# Patient Record
Sex: Male | Born: 1935 | Race: White | Hispanic: No | Smoking: Never smoker
Health system: Southern US, Community
[De-identification: ages and names within clinical notes are randomized; demographics above are authoritative.]

## PROBLEM LIST (undated history)

## (undated) DIAGNOSIS — C61 Malignant neoplasm of prostate: Secondary | ICD-10-CM

## (undated) HISTORY — PX: HERNIA REPAIR: SHX51

## (undated) HISTORY — PX: CHOLECYSTECTOMY: SHX55

## (undated) HISTORY — PX: PROSTATECTOMY: SHX69

---

## 2014-10-30 ENCOUNTER — Emergency Department (HOSPITAL_COMMUNITY): Payer: Medicare HMO

## 2014-10-30 ENCOUNTER — Encounter (HOSPITAL_COMMUNITY): Payer: Self-pay | Admitting: Family Medicine

## 2014-10-30 ENCOUNTER — Inpatient Hospital Stay (HOSPITAL_COMMUNITY)
Admission: EM | Admit: 2014-10-30 | Discharge: 2014-11-02 | DRG: 470 | Disposition: A | Discharge status: Skilled Nursing Facility | Payer: Medicare HMO | Attending: Internal Medicine | Admitting: Internal Medicine

## 2014-10-30 DIAGNOSIS — W19XXXA Unspecified fall, initial encounter: Secondary | ICD-10-CM

## 2014-10-30 DIAGNOSIS — D539 Nutritional anemia, unspecified: Secondary | ICD-10-CM | POA: Diagnosis present

## 2014-10-30 DIAGNOSIS — R52 Pain, unspecified: Secondary | ICD-10-CM | POA: Diagnosis not present

## 2014-10-30 DIAGNOSIS — Z9079 Acquired absence of other genital organ(s): Secondary | ICD-10-CM | POA: Diagnosis present

## 2014-10-30 DIAGNOSIS — M1611 Unilateral primary osteoarthritis, right hip: Secondary | ICD-10-CM | POA: Diagnosis present

## 2014-10-30 DIAGNOSIS — E440 Moderate protein-calorie malnutrition: Secondary | ICD-10-CM | POA: Insufficient documentation

## 2014-10-30 DIAGNOSIS — Z7982 Long term (current) use of aspirin: Secondary | ICD-10-CM

## 2014-10-30 DIAGNOSIS — Z96649 Presence of unspecified artificial hip joint: Secondary | ICD-10-CM

## 2014-10-30 DIAGNOSIS — S72001A Fracture of unspecified part of neck of right femur, initial encounter for closed fracture: Secondary | ICD-10-CM | POA: Diagnosis not present

## 2014-10-30 DIAGNOSIS — Z8546 Personal history of malignant neoplasm of prostate: Secondary | ICD-10-CM

## 2014-10-30 DIAGNOSIS — Z791 Long term (current) use of non-steroidal anti-inflammatories (NSAID): Secondary | ICD-10-CM

## 2014-10-30 DIAGNOSIS — R911 Solitary pulmonary nodule: Secondary | ICD-10-CM | POA: Diagnosis present

## 2014-10-30 DIAGNOSIS — Z79899 Other long term (current) drug therapy: Secondary | ICD-10-CM

## 2014-10-30 DIAGNOSIS — S72009A Fracture of unspecified part of neck of unspecified femur, initial encounter for closed fracture: Secondary | ICD-10-CM | POA: Diagnosis present

## 2014-10-30 HISTORY — DX: Malignant neoplasm of prostate: C61

## 2014-10-30 NOTE — ED Provider Notes (Signed)
CSN: [id number]     Arrival date & time 10/30/14  2310 History   First MD Initiated Contact with Patient 10/30/14 2327     Chief Complaint  Patient presents with  . Fall  . Ankle Injury     (Consider location/radiation/quality/duration/timing/severity/associated sxs/prior Treatment) HPI Comments: Patient is an 81 year old male with little past medical history. He presents for evaluation of a fall. He states he was getting out of the back of a rental truck and climbing down the steps when he missed the second step and fell on his right side. He is complaining of pain in his leg. He is unable to explain exactly where his pain is. He denies any back pain. He denies any numbness or tingling. He states that initially he could not move his leg, however he is now able to.  Patient is a 81 y.o. male presenting with fall. The history is provided by the patient.  Fall This is a new problem. The current episode started 1 to 2 hours ago. The problem occurs constantly. The problem has not changed since onset.Nothing aggravates the symptoms. Nothing relieves the symptoms. He has tried nothing for the symptoms. The treatment provided no relief.    History reviewed. No pertinent past medical history. Past Surgical History  Procedure Laterality Date  . Prostatectomy     History reviewed. No pertinent family history. History  Substance Use Topics  . Smoking status: Never Smoker   . Smokeless tobacco: Not on file  . Alcohol Use: No    Review of Systems  All other systems reviewed and are negative.     Allergies  Review of patient's allergies indicates not on file.  Home Medications   Prior to Admission medications   Not on File   BP 114/60 mmHg  Pulse 72  Temp(Src) 98.4 F (36.9 C) (Oral)  Resp 16  Ht 5\' 10"  (1.778 m)  Wt 158 lb (71.668 kg)  BMI 22.67 kg/m2  SpO2 97% Physical Exam  Constitutional: He is oriented to person, place, and time. He appears well-developed and  well-nourished. No distress.  HENT:  Head: Normocephalic and atraumatic.  Neck: Normal range of motion. Neck supple.  Cardiovascular: Normal rate, regular rhythm and normal heart sounds.   No murmur heard. Pulmonary/Chest: Effort normal and breath sounds normal. No respiratory distress.  Abdominal: Soft. Bowel sounds are normal. He exhibits no distension. There is no tenderness.  Musculoskeletal: He exhibits no edema.  The right leg appears grossly normal. There is no shortening or external rotation. DP and PT pulses are easily palpable. Sensation and motor are intact to the foot. I see no obvious deformity of the ankle, knee, or hip, or tib-fib /femur area.  Neurological: He is alert and oriented to person, place, and time.  Skin: Skin is warm and dry. He is not diaphoretic.  Nursing note and vitals reviewed.   ED Course  Procedures (including critical care time) Labs Review Labs Reviewed - No data to display  Imaging Review No results found.   EKG Interpretation   Date/Time:  Monday October 31 2014 00:55:59 EDT Ventricular Rate:  79 PR Interval:  201 QRS Duration: 100 QT Interval:  418 QTC Calculation: 479 R Axis:   74 Text Interpretation:  Sinus rhythm Borderline repolarization abnormality  Borderline prolonged QT interval Confirmed by Maretta Overdorf  MD, Tamila Gaulin ([id number]) on  10/31/2014 1:02:52 AM      MDM   Final diagnoses:  None    Patient is here from [place]  assisting the family member with a move when he fell off the stairs of the moving van and fractured his right hip. X-rays reveal a femoral neck fracture. I've spoken with Dr. Mayer Camel from orthopedics who is recommending admission to [facility] for surgical repair. I've spoken with Dr. Hal Hope who agrees to admit.    Veryl Speak, MD 10/31/14 [phone]

## 2014-10-30 NOTE — ED Notes (Signed)
Bed: DB52 Expected date:  Expected time:  Means of arrival:  Comments: 81yr old fall, ankle pain

## 2014-10-30 NOTE — ED Notes (Signed)
Patient was transported from [facility]. Pt was getting down from a box truck, missed the 2nd step, twisted, and fell on his right side. Pt's right ankle is rotated outward but can not move his right leg inward.

## 2014-10-31 ENCOUNTER — Encounter (HOSPITAL_COMMUNITY): Payer: Self-pay | Admitting: Internal Medicine

## 2014-10-31 ENCOUNTER — Inpatient Hospital Stay (HOSPITAL_COMMUNITY): Payer: Medicare HMO | Admitting: Anesthesiology

## 2014-10-31 ENCOUNTER — Encounter (HOSPITAL_COMMUNITY)
Admission: EM | Disposition: A | Discharge status: Skilled Nursing Facility | Payer: Self-pay | Source: Home / Self Care | Attending: Internal Medicine

## 2014-10-31 ENCOUNTER — Inpatient Hospital Stay (HOSPITAL_COMMUNITY): Payer: Medicare HMO

## 2014-10-31 ENCOUNTER — Emergency Department (HOSPITAL_COMMUNITY): Payer: Medicare HMO

## 2014-10-31 DIAGNOSIS — Z791 Long term (current) use of non-steroidal anti-inflammatories (NSAID): Secondary | ICD-10-CM | POA: Diagnosis not present

## 2014-10-31 DIAGNOSIS — D539 Nutritional anemia, unspecified: Secondary | ICD-10-CM | POA: Diagnosis present

## 2014-10-31 DIAGNOSIS — Z9079 Acquired absence of other genital organ(s): Secondary | ICD-10-CM | POA: Diagnosis present

## 2014-10-31 DIAGNOSIS — M1611 Unilateral primary osteoarthritis, right hip: Secondary | ICD-10-CM | POA: Diagnosis present

## 2014-10-31 DIAGNOSIS — E440 Moderate protein-calorie malnutrition: Secondary | ICD-10-CM | POA: Insufficient documentation

## 2014-10-31 DIAGNOSIS — Z8546 Personal history of malignant neoplasm of prostate: Secondary | ICD-10-CM | POA: Diagnosis not present

## 2014-10-31 DIAGNOSIS — S72009A Fracture of unspecified part of neck of unspecified femur, initial encounter for closed fracture: Secondary | ICD-10-CM | POA: Diagnosis present

## 2014-10-31 DIAGNOSIS — R52 Pain, unspecified: Secondary | ICD-10-CM | POA: Diagnosis present

## 2014-10-31 DIAGNOSIS — Z7982 Long term (current) use of aspirin: Secondary | ICD-10-CM | POA: Diagnosis not present

## 2014-10-31 DIAGNOSIS — Z79899 Other long term (current) drug therapy: Secondary | ICD-10-CM | POA: Diagnosis not present

## 2014-10-31 DIAGNOSIS — S72001A Fracture of unspecified part of neck of right femur, initial encounter for closed fracture: Secondary | ICD-10-CM | POA: Diagnosis not present

## 2014-10-31 DIAGNOSIS — R911 Solitary pulmonary nodule: Secondary | ICD-10-CM | POA: Diagnosis present

## 2014-10-31 HISTORY — PX: HIP ARTHROPLASTY: SHX981

## 2014-10-31 LAB — CBC WITH DIFFERENTIAL/PLATELET
BASOS PCT: 0 % (ref 0–1)
BASOS PCT: 0 % (ref 0–1)
Basophils Absolute: 0 10*3/uL (ref 0.0–0.1)
Basophils Absolute: 0 10*3/uL (ref 0.0–0.1)
EOS ABS: 0.1 10*3/uL (ref 0.0–0.7)
EOS PCT: 2 % (ref 0–5)
EOS PCT: 2 % (ref 0–5)
Eosinophils Absolute: 0.1 10*3/uL (ref 0.0–0.7)
HCT: 38.3 % — ABNORMAL LOW (ref 39.0–52.0)
HEMATOCRIT: 35.6 % — AB (ref 39.0–52.0)
HEMOGLOBIN: 12.8 g/dL — AB (ref 13.0–17.0)
HEMOGLOBIN: 12 g/dL — AB (ref 13.0–17.0)
LYMPHS ABS: 1.5 10*3/uL (ref 0.7–4.0)
Lymphocytes Relative: 15 % (ref 12–46)
Lymphocytes Relative: 20 % (ref 12–46)
Lymphs Abs: 1.4 10*3/uL (ref 0.7–4.0)
MCH: 34.3 pg — ABNORMAL HIGH (ref 26.0–34.0)
MCH: 34.2 pg — AB (ref 26.0–34.0)
MCHC: 33.4 g/dL (ref 30.0–36.0)
MCHC: 33.7 g/dL (ref 30.0–36.0)
MCV: 102.7 fL — ABNORMAL HIGH (ref 78.0–100.0)
MCV: 101.4 fL — ABNORMAL HIGH (ref 78.0–100.0)
MONO ABS: 0.6 10*3/uL (ref 0.1–1.0)
MONOS PCT: 6 % (ref 3–12)
MONOS PCT: 8 % (ref 3–12)
Monocytes Absolute: 0.6 10*3/uL (ref 0.1–1.0)
NEUTROS ABS: 7.4 10*3/uL (ref 1.7–7.7)
NEUTROS ABS: 4.9 10*3/uL (ref 1.7–7.7)
Neutrophils Relative %: 77 % (ref 43–77)
Neutrophils Relative %: 70 % (ref 43–77)
PLATELETS: 174 10*3/uL (ref 150–400)
Platelets: 202 10*3/uL (ref 150–400)
RBC: 3.73 MIL/uL — ABNORMAL LOW (ref 4.22–5.81)
RBC: 3.51 MIL/uL — ABNORMAL LOW (ref 4.22–5.81)
RDW: 13.7 % (ref 11.5–15.5)
RDW: 13.8 % (ref 11.5–15.5)
WBC: 9.6 10*3/uL (ref 4.0–10.5)
WBC: 7 10*3/uL (ref 4.0–10.5)

## 2014-10-31 LAB — BASIC METABOLIC PANEL
ANION GAP: 6 (ref 5–15)
BUN: 22 mg/dL — AB (ref 6–20)
CHLORIDE: 110 mmol/L (ref 101–111)
CO2: 25 mmol/L (ref 22–32)
Calcium: 8.9 mg/dL (ref 8.9–10.3)
Creatinine, Ser: 1.05 mg/dL (ref 0.61–1.24)
GFR calc non Af Amer: >60 mL/min (ref >60)
Glucose, Bld: 146 mg/dL — ABNORMAL HIGH (ref 65–99)
Potassium: 3.7 mmol/L (ref 3.5–5.1)
SODIUM: 141 mmol/L (ref 135–145)

## 2014-10-31 LAB — COMPREHENSIVE METABOLIC PANEL
ALK PHOS: 60 U/L (ref 38–126)
ALT: 16 U/L — ABNORMAL LOW (ref 17–63)
AST: 22 U/L (ref 15–41)
Albumin: 3.1 g/dL — ABNORMAL LOW (ref 3.5–5.0)
Anion gap: 4 — ABNORMAL LOW (ref 5–15)
BUN: 16 mg/dL (ref 6–20)
CO2: 28 mmol/L (ref 22–32)
CREATININE: 1.05 mg/dL (ref 0.61–1.24)
Calcium: 8.9 mg/dL (ref 8.9–10.3)
Chloride: 110 mmol/L (ref 101–111)
GFR calc Af Amer: >60 mL/min (ref >60)
GLUCOSE: 107 mg/dL — AB (ref 65–99)
POTASSIUM: 3.9 mmol/L (ref 3.5–5.1)
SODIUM: 142 mmol/L (ref 135–145)
TOTAL PROTEIN: 6.3 g/dL — AB (ref 6.5–8.1)
Total Bilirubin: 0.6 mg/dL (ref 0.3–1.2)

## 2014-10-31 LAB — ABO/RH: ABO/RH(D): O POS

## 2014-10-31 LAB — MRSA PCR SCREENING: MRSA by PCR: NEGATIVE

## 2014-10-31 LAB — PROTIME-INR
INR: 1.18 (ref 0.00–1.49)
PROTHROMBIN TIME: 15.2 s (ref 11.6–15.2)

## 2014-10-31 LAB — TYPE AND SCREEN
ABO/RH(D): O POS
Antibody Screen: NEGATIVE

## 2014-10-31 SURGERY — HEMIARTHROPLASTY, HIP, DIRECT ANTERIOR APPROACH, FOR FRACTURE
Anesthesia: Monitor Anesthesia Care | Site: Hip | Laterality: Right

## 2014-10-31 MED ORDER — DIPHENHYDRAMINE HCL 12.5 MG/5ML PO ELIX: 12.5000 mg | ORAL_SOLUTION | ORAL | Status: DC | PRN

## 2014-10-31 MED ORDER — LIDOCAINE HCL (CARDIAC) 20 MG/ML IV SOLN
INTRAVENOUS | Status: AC
  Filled 2014-10-31: qty 5

## 2014-10-31 MED ORDER — BUPIVACAINE HCL (PF) 0.5 % IJ SOLN
INTRAMUSCULAR | Status: DC | PRN
  Administered 2014-10-31: 3 mL via INTRATHECAL

## 2014-10-31 MED ORDER — LACTATED RINGERS IV SOLN
INTRAVENOUS | Status: DC | PRN
  Administered 2014-10-31 (×2): via INTRAVENOUS

## 2014-10-31 MED ORDER — BUPIVACAINE-EPINEPHRINE (PF) 0.5% -1:200000 IJ SOLN
INTRAMUSCULAR | Status: AC
  Filled 2014-10-31: qty 30

## 2014-10-31 MED ORDER — PROPOFOL 10 MG/ML IV BOLUS
INTRAVENOUS | Status: AC
  Filled 2014-10-31: qty 20

## 2014-10-31 MED ORDER — ASPIRIN EC 325 MG PO TBEC: 325.0000 mg | DELAYED_RELEASE_TABLET | Freq: Two times a day (BID) | ORAL | Status: DC

## 2014-10-31 MED ORDER — TRANEXAMIC ACID 1000 MG/10ML IV SOLN
1000.0000 mg | INTRAVENOUS | Status: AC
  Administered 2014-10-31: 1000 mg via INTRAVENOUS
  Filled 2014-10-31: qty 10

## 2014-10-31 MED ORDER — ENSURE ENLIVE PO LIQD
237.0000 mL | Freq: Every day | ORAL | Status: DC
  Administered 2014-11-01 – 2014-11-02 (×2): 237 mL via ORAL

## 2014-10-31 MED ORDER — HYDROMORPHONE HCL 1 MG/ML IJ SOLN
0.5000 mg | INTRAMUSCULAR | Status: DC | PRN
  Administered 2014-11-01: 0.5 mg via INTRAVENOUS
  Filled 2014-10-31: qty 1

## 2014-10-31 MED ORDER — CEFAZOLIN SODIUM-DEXTROSE 2-3 GM-% IV SOLR
2.0000 g | INTRAVENOUS | Status: AC
  Administered 2014-10-31: 2 g via INTRAVENOUS
  Filled 2014-10-31: qty 50

## 2014-10-31 MED ORDER — METOCLOPRAMIDE HCL 5 MG/ML IJ SOLN: 5.0000 mg | Freq: Three times a day (TID) | INTRAMUSCULAR | Status: DC | PRN

## 2014-10-31 MED ORDER — DEXTROSE-NACL 5-0.45 % IV SOLN
INTRAVENOUS | Status: DC
  Administered 2014-10-31 – 2014-11-02 (×2): via INTRAVENOUS

## 2014-10-31 MED ORDER — DEXAMETHASONE SODIUM PHOSPHATE 10 MG/ML IJ SOLN
10.0000 mg | Freq: Once | INTRAMUSCULAR | Status: AC
  Administered 2014-11-01: 10 mg via INTRAVENOUS
  Filled 2014-10-31: qty 1

## 2014-10-31 MED ORDER — CEFUROXIME SODIUM 1.5 G IJ SOLR
INTRAMUSCULAR | Status: DC | PRN
  Administered 2014-10-31: 1.5 g

## 2014-10-31 MED ORDER — ACETAMINOPHEN 325 MG PO TABS: 325.0000 mg | ORAL_TABLET | ORAL | Status: DC | PRN

## 2014-10-31 MED ORDER — OXYCODONE HCL 5 MG PO TABS
5.0000 mg | ORAL_TABLET | ORAL | Status: DC | PRN
Start: 2014-10-31 — End: 2014-11-02

## 2014-10-31 MED ORDER — DOCUSATE SODIUM 100 MG PO CAPS
100.0000 mg | ORAL_CAPSULE | Freq: Two times a day (BID) | ORAL | Status: DC
  Administered 2014-10-31 – 2014-11-02 (×4): 100 mg via ORAL
  Filled 2014-10-31 (×3): qty 1

## 2014-10-31 MED ORDER — ALUM & MAG HYDROXIDE-SIMETH 200-200-20 MG/5ML PO SUSP: 30.0000 mL | ORAL | Status: DC | PRN

## 2014-10-31 MED ORDER — SODIUM CHLORIDE 0.9 % IR SOLN
Status: DC | PRN
Start: 2014-10-31 — End: 2014-10-31
  Administered 2014-10-31: 1000 mL
  Administered 2014-10-31: 3000 mL

## 2014-10-31 MED ORDER — HYDROCODONE-ACETAMINOPHEN 5-325 MG PO TABS
1.0000 | ORAL_TABLET | Freq: Four times a day (QID) | ORAL | Status: DC | PRN
  Administered 2014-10-31 – 2014-11-02 (×6): 1 via ORAL
  Filled 2014-10-31 (×6): qty 1

## 2014-10-31 MED ORDER — ZOLPIDEM TARTRATE 5 MG PO TABS: 5.0000 mg | ORAL_TABLET | Freq: Every evening | ORAL | Status: DC | PRN

## 2014-10-31 MED ORDER — OXYCODONE-ACETAMINOPHEN 5-325 MG PO TABS: 1.0000 | ORAL_TABLET | ORAL | Status: DC | PRN

## 2014-10-31 MED ORDER — ONDANSETRON HCL 4 MG/2ML IJ SOLN: 4.0000 mg | Freq: Four times a day (QID) | INTRAMUSCULAR | Status: DC | PRN

## 2014-10-31 MED ORDER — TIZANIDINE HCL 2 MG PO CAPS: 2.0000 mg | ORAL_CAPSULE | Freq: Three times a day (TID) | ORAL | Status: DC

## 2014-10-31 MED ORDER — FENTANYL CITRATE (PF) 250 MCG/5ML IJ SOLN
INTRAMUSCULAR | Status: AC
  Filled 2014-10-31: qty 5

## 2014-10-31 MED ORDER — ONDANSETRON HCL 4 MG PO TABS: 4.0000 mg | ORAL_TABLET | Freq: Four times a day (QID) | ORAL | Status: DC | PRN

## 2014-10-31 MED ORDER — METHOCARBAMOL 1000 MG/10ML IJ SOLN
500.0000 mg | Freq: Four times a day (QID) | INTRAVENOUS | Status: DC | PRN
  Filled 2014-10-31: qty 5

## 2014-10-31 MED ORDER — CEFUROXIME SODIUM 1.5 G IJ SOLR
INTRAMUSCULAR | Status: AC
  Filled 2014-10-31: qty 1.5

## 2014-10-31 MED ORDER — METOCLOPRAMIDE HCL 5 MG PO TABS: 5.0000 mg | ORAL_TABLET | Freq: Three times a day (TID) | ORAL | Status: DC | PRN

## 2014-10-31 MED ORDER — POTASSIUM CHLORIDE IN NACL 20-0.45 MEQ/L-% IV SOLN
INTRAVENOUS | Status: DC
  Administered 2014-10-31 – 2014-11-01 (×2): via INTRAVENOUS
  Filled 2014-10-31 (×4): qty 1000

## 2014-10-31 MED ORDER — ONDANSETRON HCL 4 MG/2ML IJ SOLN
INTRAMUSCULAR | Status: AC
  Filled 2014-10-31: qty 2

## 2014-10-31 MED ORDER — PHENOL 1.4 % MT LIQD: 1.0000 | OROMUCOSAL | Status: DC | PRN

## 2014-10-31 MED ORDER — PROPOFOL 1000 MG/100ML IV EMUL
INTRAVENOUS | Status: AC
  Filled 2014-10-31: qty 100

## 2014-10-31 MED ORDER — ROCURONIUM BROMIDE 50 MG/5ML IV SOLN
INTRAVENOUS | Status: AC
  Filled 2014-10-31: qty 1

## 2014-10-31 MED ORDER — SENNOSIDES-DOCUSATE SODIUM 8.6-50 MG PO TABS: 1.0000 | ORAL_TABLET | Freq: Every evening | ORAL | Status: DC | PRN

## 2014-10-31 MED ORDER — ACETAMINOPHEN 325 MG PO TABS: 650.0000 mg | ORAL_TABLET | Freq: Four times a day (QID) | ORAL | Status: DC | PRN

## 2014-10-31 MED ORDER — FENTANYL CITRATE (PF) 100 MCG/2ML IJ SOLN
INTRAMUSCULAR | Status: DC | PRN
  Administered 2014-10-31 (×5): 50 ug via INTRAVENOUS

## 2014-10-31 MED ORDER — METHOCARBAMOL 500 MG PO TABS: 500.0000 mg | ORAL_TABLET | Freq: Four times a day (QID) | ORAL | Status: DC | PRN

## 2014-10-31 MED ORDER — ACETAMINOPHEN 160 MG/5ML PO SOLN
325.0000 mg | ORAL | Status: DC | PRN
  Filled 2014-10-31: qty 20.3

## 2014-10-31 MED ORDER — ACETAMINOPHEN 650 MG RE SUPP: 650.0000 mg | Freq: Four times a day (QID) | RECTAL | Status: DC | PRN

## 2014-10-31 MED ORDER — CEFAZOLIN SODIUM-DEXTROSE 2-3 GM-% IV SOLR
2.0000 g | Freq: Three times a day (TID) | INTRAVENOUS | Status: DC
  Filled 2014-10-31 (×3): qty 50

## 2014-10-31 MED ORDER — SODIUM CHLORIDE 0.9 % IV SOLN: INTRAVENOUS | Status: DC

## 2014-10-31 MED ORDER — PROPOFOL INFUSION 10 MG/ML OPTIME
INTRAVENOUS | Status: DC | PRN
  Administered 2014-10-31: 20 ug/kg/min via INTRAVENOUS

## 2014-10-31 MED ORDER — METOPROLOL TARTRATE 1 MG/ML IV SOLN
5.0000 mg | INTRAVENOUS | Status: DC | PRN
  Administered 2014-10-31: 5 mg via INTRAVENOUS
  Filled 2014-10-31 (×3): qty 5

## 2014-10-31 MED ORDER — LACTATED RINGERS IV SOLN
INTRAVENOUS | Status: DC
  Administered 2014-10-31: 15:00:00 via INTRAVENOUS

## 2014-10-31 MED ORDER — ASPIRIN EC 325 MG PO TBEC
325.0000 mg | DELAYED_RELEASE_TABLET | Freq: Every day | ORAL | Status: DC
  Administered 2014-11-01 – 2014-11-02 (×2): 325 mg via ORAL
  Filled 2014-10-31 (×2): qty 1

## 2014-10-31 MED ORDER — MENTHOL 3 MG MT LOZG: 1.0000 | LOZENGE | OROMUCOSAL | Status: DC | PRN

## 2014-10-31 MED ORDER — FENTANYL CITRATE (PF) 100 MCG/2ML IJ SOLN: 25.0000 ug | INTRAMUSCULAR | Status: DC | PRN

## 2014-10-31 MED ORDER — FLEET ENEMA 7-19 GM/118ML RE ENEM: 1.0000 | ENEMA | Freq: Once | RECTAL | Status: AC | PRN

## 2014-10-31 MED ORDER — BISACODYL 5 MG PO TBEC: 5.0000 mg | DELAYED_RELEASE_TABLET | Freq: Every day | ORAL | Status: DC | PRN

## 2014-10-31 MED ORDER — MORPHINE SULFATE 2 MG/ML IJ SOLN
0.5000 mg | INTRAMUSCULAR | Status: DC | PRN
  Administered 2014-10-31: 0.5 mg via INTRAVENOUS
  Filled 2014-10-31: qty 1

## 2014-10-31 SURGICAL SUPPLY — 73 items
BALL HIP ARTICU 28 +5 (Hips) ×1 IMPLANT
BIT DRILL 5/64X5 DISP (BIT) ×2 IMPLANT
BLADE SAW SGTL MED 73X18.5 STR (BLADE) ×2 IMPLANT
BRUSH FEMORAL CANAL (MISCELLANEOUS) ×2 IMPLANT
CAPT HIP HEMI 2 ×2 IMPLANT
CEMENT BONE DEPUY (Cement) ×4 IMPLANT
CEMENT HV SMART SET (Cement) IMPLANT
CENTRALIZER STEM DISTAL 15MM (Hips) ×2 IMPLANT
COVER BACK TABLE 24X17X13 BIG (DRAPES) IMPLANT
DRAPE IMP U-DRAPE 54X76 (DRAPES) ×2 IMPLANT
DRAPE ORTHO SPLIT 77X108 STRL (DRAPES) ×2
DRAPE PROXIMA HALF (DRAPES) ×2 IMPLANT
DRAPE SURG ORHT 6 SPLT 77X108 (DRAPES) ×2 IMPLANT
DRAPE U-SHAPE 47X51 STRL (DRAPES) ×2 IMPLANT
DRILL STEP  6.4 (BIT) ×2 IMPLANT
DRSG AQUACEL AG ADV 3.5X10 (GAUZE/BANDAGES/DRESSINGS) ×2 IMPLANT
DRSG PAD ABDOMINAL 8X10 ST (GAUZE/BANDAGES/DRESSINGS) ×2 IMPLANT
DURAPREP 26ML APPLICATOR (WOUND CARE) ×2 IMPLANT
ELECT BLADE 4.0 EZ CLEAN MEGAD (MISCELLANEOUS) ×2
ELECT BLADE 6.5 EXT (BLADE) IMPLANT
ELECT CAUTERY BLADE 6.4 (BLADE) ×2 IMPLANT
ELECT REM PT RETURN 9FT ADLT (ELECTROSURGICAL) ×2
ELECTRODE BLDE 4.0 EZ CLN MEGD (MISCELLANEOUS) ×1 IMPLANT
ELECTRODE REM PT RTRN 9FT ADLT (ELECTROSURGICAL) ×1 IMPLANT
EVACUATOR 1/8 PVC DRAIN (DRAIN) IMPLANT
GAUZE SPONGE 4X4 12PLY STRL (GAUZE/BANDAGES/DRESSINGS) ×2 IMPLANT
GAUZE XEROFORM 5X9 LF (GAUZE/BANDAGES/DRESSINGS) ×2 IMPLANT
GLOVE BIO SURGEON STRL SZ7.5 (GLOVE) ×2 IMPLANT
GLOVE BIO SURGEON STRL SZ8.5 (GLOVE) ×2 IMPLANT
GLOVE BIOGEL PI IND STRL 8 (GLOVE) ×1 IMPLANT
GLOVE BIOGEL PI IND STRL 9 (GLOVE) ×1 IMPLANT
GLOVE BIOGEL PI INDICATOR 8 (GLOVE) ×1
GLOVE BIOGEL PI INDICATOR 9 (GLOVE) ×1
GOWN STRL REUS W/ TWL LRG LVL3 (GOWN DISPOSABLE) ×1 IMPLANT
GOWN STRL REUS W/ TWL XL LVL3 (GOWN DISPOSABLE) ×3 IMPLANT
GOWN STRL REUS W/TWL LRG LVL3 (GOWN DISPOSABLE) ×1
GOWN STRL REUS W/TWL XL LVL3 (GOWN DISPOSABLE) ×3
HANDPIECE INTERPULSE COAX TIP (DISPOSABLE) ×1
HIP BALL ARTICU 28 +5 (Hips) ×2 IMPLANT
IMMOBILIZER KNEE 20 (SOFTGOODS) IMPLANT
KIT BASIN OR (CUSTOM PROCEDURE TRAY) ×2 IMPLANT
KIT ROOM TURNOVER OR (KITS) ×2 IMPLANT
MANIFOLD NEPTUNE II (INSTRUMENTS) ×2 IMPLANT
NEEDLE 1/2 CIR MAYO (NEEDLE) IMPLANT
NS IRRIG 1000ML POUR BTL (IV SOLUTION) ×2 IMPLANT
PACK TOTAL JOINT (CUSTOM PROCEDURE TRAY) ×2 IMPLANT
PACK UNIVERSAL I (CUSTOM PROCEDURE TRAY) ×2 IMPLANT
PAD ARMBOARD 7.5X6 YLW CONV (MISCELLANEOUS) ×4 IMPLANT
PASSER SUT SWANSON 36MM LOOP (INSTRUMENTS) IMPLANT
PRESSURIZER FEMORAL UNIV (MISCELLANEOUS) ×2 IMPLANT
RESTRICTOR CEMENT SZ 5 C-STEM (Cement) ×2 IMPLANT
SET HNDPC FAN SPRY TIP SCT (DISPOSABLE) ×1 IMPLANT
SROM STM LNG 36+8L 20X15 (Hips) ×2 IMPLANT
STAPLER VISISTAT 35W (STAPLE) ×2 IMPLANT
STEM SUMMIT CEMENTED BASIC SZ6 (Joint) ×1 IMPLANT
SUCTION FRAZIER TIP 10 FR DISP (SUCTIONS) IMPLANT
SUMMIT CEMENTED BASIC SZ 6 (Joint) ×2 IMPLANT
SUT ETHIBOND 2 V 37 (SUTURE) ×2 IMPLANT
SUT ETHILON 3 0 FSL (SUTURE) IMPLANT
SUT PASSER 2.0 195M (MISCELLANEOUS) ×2 IMPLANT
SUT VIC AB 0 CT1 27 (SUTURE) ×1
SUT VIC AB 0 CT1 27XBRD ANBCTR (SUTURE) ×1 IMPLANT
SUT VIC AB 1 CTX 36 (SUTURE) ×1
SUT VIC AB 1 CTX36XBRD ANBCTR (SUTURE) ×1 IMPLANT
SUT VIC AB 2-0 CT1 27 (SUTURE) ×1
SUT VIC AB 2-0 CT1 TAPERPNT 27 (SUTURE) ×1 IMPLANT
SYR CONTROL 10ML LL (SYRINGE) IMPLANT
TOWEL OR 17X24 6PK STRL BLUE (TOWEL DISPOSABLE) ×2 IMPLANT
TOWEL OR 17X26 10 PK STRL BLUE (TOWEL DISPOSABLE) ×2 IMPLANT
TOWER CARTRIDGE SMART MIX (DISPOSABLE) ×2 IMPLANT
TRAY FOLEY CATH 14FR (SET/KITS/TRAYS/PACK) IMPLANT
TRAY FOLEY CATH 16FRSI W/METER (SET/KITS/TRAYS/PACK) ×2 IMPLANT
WATER STERILE IRR 1000ML POUR (IV SOLUTION) ×8 IMPLANT

## 2014-10-31 NOTE — Anesthesia Procedure Notes (Signed)
Spinal Patient location during procedure: OR Staffing Anesthesiologist: Brenya Taulbee, CHRIS Preanesthetic Checklist Completed: patient identified, surgical consent, pre-op evaluation, timeout performed, IV checked, risks and benefits discussed and monitors and equipment checked Spinal Block Patient position: right lateral decubitus Prep: site prepped and draped and DuraPrep Patient monitoring: heart rate, cardiac monitor, continuous pulse ox and blood pressure Approach: midline Location: L3-4 Injection technique: single-shot Needle Needle type: Pencan  Needle gauge: 24 G Needle length: 10 cm Assessment Sensory level: T8

## 2014-10-31 NOTE — Progress Notes (Signed)
[facility] progress note. Chief complaint. Transfer note. History of present illness. This 81 year old male suffered a mechanical fall and experienced right hip pain. Came to [facility] long emergency room where he was diagnosed with a hip fracture. Orthopedics was consulted and they requested the patient be transferred to [facility] for fracture treatment. Patient is now arrived at [facility] in transfer 9 seeing him at bedside to ensure he remains clinically stable and that his orders transferred here appropriately. Physical exam. Vital signs. Temperature 97.4, pulse 59, respiration 18, blood pressure 110/62. O2 sats 98%. General appearance. Well-developed elderly male who is alert and in no distress. Cardiac. Rate and rhythm regular. Lungs. Breath sounds clear and equal. Abdomen. Soft with positive bowel sounds. Extremities. Peripheral pulses are intact. Impression/plan. Problem #1. Right hip fracture. Patient has been seen by orthopedics and surgery is anticipated later today. We'll follow for orthopedics recommendations. Problem #2. Lung nodule. Outpatient follow-up. Problem #3. Anemia. This can be worked up as an outpatient. Problem #4. History of prostate cancer. Status post prostatectomy. The patient appears clinically stable post transfer. All orders appear to of transferred appropriately.

## 2014-10-31 NOTE — Anesthesia Preprocedure Evaluation (Signed)
Anesthesia Evaluation  Patient identified by MRN, date of birth, ID band Patient awake    Reviewed: Allergy & Precautions, NPO status , Patient's Chart, lab work & pertinent test results  History of Anesthesia Complications Negative for: history of anesthetic complications  Airway Mallampati: II  TM Distance: >3 FB Neck ROM: Full    Dental  (+) Teeth Intact   Pulmonary neg pulmonary ROS,  breath sounds clear to auscultation        Cardiovascular negative cardio ROS  Rhythm:Regular     Neuro/Psych negative neurological ROS  negative psych ROS   GI/Hepatic negative GI ROS, Neg liver ROS,   Endo/Other  negative endocrine ROS  Renal/GU negative Renal ROS     Musculoskeletal Fractured right hip   Abdominal   Peds  Hematology  (+) anemia ,   Anesthesia Other Findings   Reproductive/Obstetrics                             Anesthesia Physical Anesthesia Plan  ASA: II  Anesthesia Plan: Spinal and MAC   Post-op Pain Management:    Induction: Intravenous  Airway Management Planned: Nasal Cannula  Additional Equipment: None  Intra-op Plan:   Post-operative Plan:   Informed Consent: I have reviewed the patients History and Physical, chart, labs and discussed the procedure including the risks, benefits and alternatives for the proposed anesthesia with the patient or authorized representative who has indicated his/her understanding and acceptance.   Dental advisory given  Plan Discussed with: CRNA and Surgeon  Anesthesia Plan Comments:         Anesthesia Quick Evaluation

## 2014-10-31 NOTE — Progress Notes (Signed)
Patient Demographics:    Marcus Duran, is a 81 y.o. male, DOB - [birth year], YCX:[id number]  Admit date - 10/30/2014   Admitting Physician Rise Patience, MD  Outpatient Primary MD for the patient is No primary care provider on file.  LOS - 0   Chief Complaint  Patient presents with  . Fall  . Ankle Injury        Subjective:    Marcus Duran today has, No headache, No chest pain, No abdominal pain - No Nausea, No new weakness tingling or numbness, No Cough - SOB. Mild right-sided hip pain. Dull nonradiating.   Assessment  & Plan :     1. Mechanical fall with right femoral neck fracture. Orthopedics consulted, due for open reduction internal fixation by orthopedics on 10/31/2014, will request orthopedics to kindly address postop weightbearing, wound care, DVT prophylaxis.  Cardio-Pulm Risk stratification for surgery and recommendations to minimize the same:-  A.Cardio-Pulmonary Risk -  this patient is a low to moderate risk  for adverse Cardio-Pulmonary  Outcome  from surgery, the risks and benefits were discussed and acceptable to the patient  Recommendations for optimizing Cardio-Pulmonary  Risk risk factors  1. Keep SBP<140, HR<85, use Lopressor 5mg  IV q4hrs PRN, or B.Blocker drip PRN. 2. Moniotr I&Os. 3. Minimal sedation and Narcotics. 4. Good pulmunary toilet. 5. PRN Nebs and as needed oxygen to keep Pox>90% 6. Hb>8, transfuse as needed- Lasix 10mg  IV after each unit PRBC Transfused.   B.Bleeding Risk - no previous surgical complications, no easy bruising,  Antiplate meds  aspirin.   Lab Results  Component Value Date   PLT 174 10/31/2014                  Lab Results  Component Value Date   INR 1.18 10/31/2014      Will request Surgeon to please Order DVT prophylaxis  of his/her choice, along with activity, weight bearing precautions and diet if appropriate.      2. Lung nodule and macrocytic anemia. Will need outpatient workup by PCP.  3. History of prostate cancer. Status post prostatectomy. Outpatient follow-up with primary oncologist and PCP post discharge.    Code Status : Full  Family Communication  : None present  Disposition Plan  : To be decided postop  Consults  :  Orthopedics  Procedures  : ORIF 10/31/2014 right femur  DVT Prophylaxis  :  SCDs and request orthopedics to recommend appropriate anticoagulation for DVT prophylaxis postop  Lab Results  Component Value Date   PLT 174 10/31/2014    Inpatient Medications  Scheduled Meds: .  ceFAZolin (ANCEF) IV  2 g Intravenous To OR  . tranexamic acid (CYKLOKAPRON) IVPB (ORTHO)  1,000 mg Intravenous To OR   Continuous Infusions: . 0.45 % NaCl with KCl 20 mEq / L 75 mL/hr at 10/31/14 1028  . sodium chloride    . dextrose 5 % and 0.45% NaCl 75 mL/hr at 10/31/14 0630   PRN Meds:.HYDROcodone-acetaminophen, morphine injection  Antibiotics  :     Anti-infectives    Start     Dose/Rate Route Frequency Ordered Stop   10/31/14 1400  ceFAZolin (ANCEF) IVPB 2 g/50 mL premix     2 g 100 mL/hr over  30 Minutes Intravenous To Surgery 10/31/14 1030 11/01/14 1400   10/31/14 0700  ceFAZolin (ANCEF) IVPB 2 g/50 mL premix  Status:  Discontinued     2 g 100 mL/hr over 30 Minutes Intravenous 3 times per day 10/31/14 0653 10/31/14 1030        Objective:   Filed Vitals:   10/30/14 2318 10/31/14 0105 10/31/14 0436 10/31/14 0600  BP:  127/63 116/55 110/62  Pulse:  80 61 59  Temp:  98.3 F (36.8 C) 98.9 F (37.2 C) 97.4 F (36.3 C)  TempSrc:  Oral Oral Oral  Resp:  18 18 18   Height: 5\' 10"  (1.778 m)     Weight: 71.668 kg (158 lb)     SpO2:  98% 93% 98%    Wt Readings from Last 3 Encounters:  10/30/14 71.668 kg (158 lb)     Intake/Output Summary (Last 24 hours) at 10/31/14  1206 Last data filed at 10/31/14 0900  Gross per 24 hour  Intake      0 ml  Output      0 ml  Net      0 ml     Physical Exam  Awake Alert, Oriented X 3, No new F.N deficits, Normal affect [place].AT,PERRAL Supple Neck,No JVD, No cervical lymphadenopathy appriciated.  Symmetrical Chest wall movement, Good air movement bilaterally, CTAB RRR,No Gallops,Rubs or new Murmurs, No Parasternal Heave +ve B.Sounds, Abd Soft, No tenderness, No organomegaly appriciated, No rebound - guarding or rigidity. No Cyanosis, Clubbing or edema, No new Rash or bruise      Data Review:   Micro Results Recent Results (from the past 240 hour(s))  MRSA PCR Screening     Status: None   Collection Time: 10/31/14  6:52 AM  Result Value Ref Range Status   MRSA by PCR NEGATIVE NEGATIVE Final    Comment:        The GeneXpert MRSA Assay (FDA approved for NASAL specimens only), is one component of a comprehensive MRSA colonization surveillance program. It is not intended to diagnose MRSA infection nor to guide or monitor treatment for MRSA infections.     Radiology Reports Dg Tibia/fibula Right  10/31/2014   CLINICAL DATA:  Twisting injury to right leg, with right leg pain and numbness. Limited range of motion. Initial encounter.  EXAM: RIGHT TIBIA AND FIBULA - 2 VIEW  COMPARISON:  None.  FINDINGS: There is no evidence of fracture or dislocation. The tibia and fibula appear grossly intact. The knee joint is grossly unremarkable in appearance. No knee joint effusion is characterized. The ankle mortise is incompletely assessed, but appears grossly unremarkable. No definite soft tissue abnormalities are characterized on radiograph.  IMPRESSION: No evidence of fracture or dislocation.   Electronically Signed   By: Garald Balding M.D.   On: 10/31/2014 00:44   Dg Chest Port 1 View  10/31/2014   CLINICAL DATA:  81 year old male with fall.  Preop chest radiograph  EXAM: PORTABLE CHEST - 1 VIEW  COMPARISON:  None.   FINDINGS: There is diffuse bilateral interstitial and vascular prominence. A 5 mm nodular density is noted the left mid lung field. Bibasilar linear atelectasis/ scarring noted. There is no focal consolidation, pleural effusion, or pneumothorax. Mild cardiomegaly. Degenerative changes of spine.  IMPRESSION: No focal consolidation.  5 mm left mid lung field nodule with   Electronically Signed   By: Anner Crete M.D.   On: 10/31/2014 01:35   Dg Knee Complete 4 Views Right  10/31/2014  CLINICAL DATA:  81 year old male with right leg twisting and pain.  EXAM: RIGHT KNEE - COMPLETE 4+ VIEW  COMPARISON:  None.  FINDINGS: There is no evidence of fracture, dislocation, or joint effusion. There is no evidence of arthropathy or other focal bone abnormality. Soft tissues are unremarkable.  IMPRESSION: No fracture or dislocation.   Electronically Signed   By: Anner Crete M.D.   On: 10/31/2014 00:42   Dg Hip Unilat With Pelvis 2-3 Views Right  10/31/2014   CLINICAL DATA:  81 year old male with right leg twisting and pain.  EXAM: DG HIP (WITH OR WITHOUT PELVIS) 2-3V RIGHT  COMPARISON:  None.  FINDINGS: There is a minimally displaced fracture of the right femoral neck. There were head of the right femur is in anatomic alignment with the acetabulum. No other fracture identified. There is degenerative changes of the lower lumbar spine. Multiple surgical clips noted in the pelvis.  IMPRESSION: Right femoral neck fracture.   Electronically Signed   By: Anner Crete M.D.   On: 10/31/2014 00:41   Dg Femur, Min 2 Views Right  10/31/2014   CLINICAL DATA:  81 year old male with right leg twisting and pain.  EXAM: RIGHT FEMUR 2 VIEWS  COMPARISON:  None.  FINDINGS: There is no fracture or dislocation of the visualized right femoral shaft. The right femoral neck fracture seen on the hip radiograph is not included in this study. The soft tissues are grossly unremarkable.  IMPRESSION: No fracture of the visualized femoral  diaphysis.   Electronically Signed   By: Anner Crete M.D.   On: 10/31/2014 00:44     CBC  Recent Labs Lab 10/31/14 0102 10/31/14 0712  WBC 9.6 7.0  HGB 12.8* 12.0*  HCT 38.3* 35.6*  PLT 202 174  MCV 102.7* 101.4*  MCH 34.3* 34.2*  MCHC 33.4 33.7  RDW 13.7 13.8  LYMPHSABS 1.5 1.4  MONOABS 0.6 0.6  EOSABS 0.1 0.1  BASOSABS 0.0 0.0    Chemistries   Recent Labs Lab 10/31/14 0102 10/31/14 0712  NA 141 142  K 3.7 3.9  CL 110 110  CO2 25 28  GLUCOSE 146* 107*  BUN 22* 16  CREATININE 1.05 1.05  CALCIUM 8.9 8.9  AST  --  22  ALT  --  16*  ALKPHOS  --  60  BILITOT  --  0.6   ------------------------------------------------------------------------------------------------------------------ estimated creatinine clearance is 52.2 mL/min (by C-G formula based on Cr of 1.05). ------------------------------------------------------------------------------------------------------------------ No results for input(s): HGBA1C in the last 72 hours. ------------------------------------------------------------------------------------------------------------------ No results for input(s): CHOL, HDL, LDLCALC, TRIG, CHOLHDL, LDLDIRECT in the last 72 hours. ------------------------------------------------------------------------------------------------------------------ No results for input(s): TSH, T4TOTAL, T3FREE, THYROIDAB in the last 72 hours.  Invalid input(s): FREET3 ------------------------------------------------------------------------------------------------------------------ No results for input(s): VITAMINB12, FOLATE, FERRITIN, TIBC, IRON, RETICCTPCT in the last 72 hours.  Coagulation profile  Recent Labs Lab 10/31/14 0102  INR 1.18    No results for input(s): DDIMER in the last 72 hours.  Cardiac Enzymes No results for input(s): CKMB, TROPONINI, MYOGLOBIN in the last 168 hours.  Invalid input(s):  CK ------------------------------------------------------------------------------------------------------------------ Invalid input(s): POCBNP   Time Spent in minutes   35   Batsheva Stevick K M.D on 10/31/2014 at 12:06 PM  Between 7am to 7pm - Pager - [phone]  After 7pm go to [email or web address] - password [email or web address]  [facility] -  Office  [phone]

## 2014-10-31 NOTE — Op Note (Signed)
Pre Op Dx: R femoral neck fracture   Post Op Dx: Same   Procedure: R hemi-hip arthroplasty using DePuy 52 mm bipolar head, implantation of #6 cemented Summit basic stem and then a revision of the stem to an S-ROM 20 x 15 x 36 by +8 x 225 mm first revision stem, 20 F large sleeve. A Summit basic stem had to be revised because the DePuy 1 cement that had been injected into the femur hardened prematurely at the 5 minute mark we crying removal of the stem which was not seated and then removal of all of the cement from the femur.  Surgeon: Kerin Salen, MD  Assistant: Kerry Hough. Barton Dubois  (present throughout entire procedure and necessary for timely completion of the procedure)   Anesthesia: Spinal   EBL: 500 mL  Fluids: 2000 mL of crystalloid Tranexamic Acid: 1 g  IV Complicationss: The initial Summit basic stem had to be removed because the DePuy 1 cement prematurely hardened at the 5 minute mark halfway down the femur. The cement left on the table remained soft until the 9 minute mark. The proximal cement and the stem remained soft until the 9 minute mark.  Indications: Independent living patient slipped and fell getting out of a truck on 10/30/2014. He normally resides in [place] and was down in [place] visiting a basketball program. Was transported to the emergency room with a presumed right hip fracture confirmed by x-ray. To decrease pain increase function and decreased morbidity have recommended and the patient has consented to hemi-hip arthroplasty. Risks and benefits of surgery have been discussed with the patient and family, his son is a Public librarian. All questions have been answered.   Procedure: Patient was identified by arm band receive preoperative IV antibiotics in the holding area at the hospital. Taken to operating room for the appropriate anesthetic monitors were attached and spinal  anesthesia induced. Patient was then rolled into the left lateral decubitus position and fixed  there with a pelvic clamp. The right lower extremity was then prepped and draped in usual sterile fashion from the ankle to the hemipelvis and a timeout procedure performed. A  14 cm incision centered over the greater trochanter allowing a lateral to posterior lateral approach to the hip was then made through the skin and subcutaneous tissue. Bleeders were sealed with electrocautery. The IT band was cut in line with skin incision at which point we encountered hematoma. Cobra retractors were placed on the superior hip joint capsule and along the inferior hip joint capsule as well. The Piriformis and short external rotators were tagged and cut off their insertion on the intertrochanteric crest and a posterior capsular flap was developed going posterior superior off the acetabulum out over the fractured femoral neck exiting posterior inferior. This exposed the fracture, the hip was internally rotated and a standard neck cut was performed with an oscillating saw 1 fingerbreadth above the lesser trochanter. This allowed [place] to extracted the femoral head, which measured at 52 mm. Trial reductions were then performed with a 51 and 52 mm trial head on a stick and the 52 mm head had the best fit and fill. There was then flexed and internally rotated exposing the proximal femur which was entered with the initiating reamer, the lateral reamer and then broaches up to a #6 broach which had the best fit and fill to the neck cut. The neck cut was then finished with the neck reamer. And a +0 52 mm trial head was  placed on the broach, the hip reduced and stability was found to be excellent he could flex to 90 and 75 of internal rotation and in full extension the hip could not be dislocated in external rotation. We then size for a #5 cement restrictor which was inserted without difficulty and the proximal femur was pulse device clean and dried with suction and sponges. A double batch of DePuy 1 cement was then mixed with 1500 mg of  Zinacef and injected into the proximal femur under pressure. At the 5 minute mark we attempted to insert by a #6 Summit basic stem with a 14 mm cementralizer. The proximal cement was soft and liquid but the stem stopped at the mid insertion point because the distal cement had hardened at the 5 minute mark. The stem was immediately removed and we removed proximal cement that was still soft using curettes going down the femur approximately 3 inches. Distally the cement was hard and at this point we set about removing the old cement. We had to start was small drills at 4 and 6 mm starting the center of the cement mantle and going distally. We then sequentially reamed up to 10 mm and using the [facility] instruments removed the hardened cement from the distal femur. This took approximately an hour and a half. C-arm imaging was used to confirm safe removal of the cement, although we did perforate the anterolateral femur with a small drill at one point. Once all the cement had been removed we elected to proceed with a longer S-ROM revision stem at 225 mm to bypass the bone that already been cemented and a small perforation in the femur. We sequentially reamed up to 15-1/2 mm to the appropriate depth for a 225 mm stem and reamed to 16 mm part way down. We then conically reamed to a 20 F large cone and inserted a trial cone. This was followed by a 22 x 15 x 2 25 x 36 with 8 mm offset trial stem in 15-20 of anteversion with a +0 trial head. Excellent stability and leg length were noted. At this point the trial components were removed, a real 20 F large sleeve was hammered into place we once again reamed to 15.5 mm to the appropriate depth. The real 20 x 15 x 2 25 x 36+8 offset stem was hammered into place and 20 of anteversion with good fit. And a +0 52 mm bipolar head was hammered on the stem and the hip was reduced. The wound is thoroughly irrigated out normal saline solution. Capsular flap and short external rotators  repaired back to the intertrochanteric crest a drill holes with a #2 Ethibond suture. The IT band was closed with running #1 Vicryl suture, the subcutaneous tissue with 0 and 2-0 undyed Vicryl suture and the skin with running 3-0 vicryl SQ suture. Aquacil dressing was then applied. The patient was unclamped rolled supine, awakened extubated and taken to the recovery room without difficulty.

## 2014-10-31 NOTE — Anesthesia Postprocedure Evaluation (Signed)
  Anesthesia Post-op Note  Patient: Marcus Duran  Procedure(s) Performed: Procedure(s): Right  ARTHROPLASTY BIPOLAR HIP (HEMIARTHROPLASTY) (Right)  Patient Location: PACU  Anesthesia Type:Spinal  Level of Consciousness: awake, alert  and oriented  Airway and Oxygen Therapy: Patient Spontanous Breathing  Post-op Pain: none  Post-op Assessment: Post-op Vital signs reviewed, Patient's Cardiovascular Status Stable and Respiratory Function Stable LLE Motor Response: Purposeful movement (able to wiggle toes, bend knee) LLE Sensation: Decreased RLE Motor Response: Purposeful movement (able to wiggle toes) RLE Sensation: Decreased (some tingling) L Sensory Level: S3-Medial thigh R Sensory Level: S3-Medial thigh  Post-op Vital Signs: Reviewed and stable  Last Vitals:  Filed Vitals:   10/31/14 2115  BP: 99/60  Pulse: 55  Temp: 36.9 C  Resp: 20    Complications: No apparent anesthesia complications

## 2014-10-31 NOTE — Discharge Instructions (Signed)

## 2014-10-31 NOTE — Transfer of Care (Signed)
Immediate Anesthesia Transfer of Care Note  Patient: Marcus Duran  Procedure(s) Performed: Procedure(s): Right  ARTHROPLASTY BIPOLAR HIP (HEMIARTHROPLASTY) (Right)  Patient Location: PACU  Anesthesia Type:Spinal  Level of Consciousness: awake  Airway & Oxygen Therapy: Patient Spontanous Breathing and Patient connected to nasal cannula oxygen  Post-op Assessment: Report given to RN and Post -op Vital signs reviewed and stable  Post vital signs: Reviewed and stable  Last Vitals:  Filed Vitals:   10/31/14 2044  BP:   Pulse: 57  Temp:   Resp: 22    Complications: No apparent anesthesia complications

## 2014-10-31 NOTE — H&P (Signed)
[facility] History and Physical  Marcus Duran WER:[id number] DOB: 11-Jun-1927 DOA: 10/30/2014  Referring physician: Dr.Delo. PCP: No primary care provider on file. patient is visiting from [place]. Specialists: None.  Chief Complaint: Fall.  HPI: Marcus Duran is a 81 y.o. male with previous history of prostate cancer presents to the ER after patient had a fall after tripping from a truck. Patient is visiting from [place]. Patient states while getting out of the truck he slipped and fell. Denies hitting his head or losing consciousness. Denies any chest pain or shortness of breath. In the ER x-rays revealed a right hip fracture. Dr. Mayer Camel on-call orthopedic surgeon was consulted and patient will be admitted for hip fracture.   Review of Systems: As presented in the history of presenting illness, rest negative.  Past Medical History  Diagnosis Date  . Prostate cancer    Past Surgical History  Procedure Laterality Date  . Prostatectomy    . Cholecystectomy    . Hernia repair     Social History:  reports that he has never smoked. He does not have any smokeless tobacco history on file. He reports that he does not drink alcohol or use illicit drugs. Where does patient live home. Can patient participate in ADLs? Yes.  No Known Allergies  Family History:  Family History  Problem Relation Age of Onset  . Hypertension Neg Hx   . Diabetes Mellitus II Neg Hx       Prior to Admission medications   Medication Sig Start Date End Date Taking? Authorizing Provider  aspirin EC 81 MG tablet Take 81 mg by mouth daily.   Yes Historical Provider, MD  naproxen sodium (ANAPROX) 220 MG tablet Take 220 mg by mouth 2 (two) times daily as needed (pain).   Yes Historical Provider, MD  polycarbophil (FIBERCON) 625 MG tablet Take 625 mg by mouth daily.   Yes Historical Provider, MD  vitamin C (ASCORBIC ACID) 500 MG tablet Take 500 mg by mouth daily.   Yes Historical Provider, MD    Physical  Exam: Filed Vitals:   10/30/14 2316 10/30/14 2318 10/31/14 0105  BP: 114/60  127/63  Pulse: 72  80  Temp: 98.4 F (36.9 C)  98.3 F (36.8 C)  TempSrc: Oral  Oral  Resp: 16  18  Height:  5\' 10"  (1.778 m)   Weight:  71.668 kg (158 lb)   SpO2: 97%  98%     General:  Moderately built and nourished.  Eyes: Anicteric no pallor.  ENT: No discharge from the ears eyes nose and mouth.  Neck: No mass felt.  Cardiovascular: S1 and S2 heard.  Respiratory: No rhonchi or crepitations.  Abdomen: Soft nontender bowel sounds present.  Skin: No rash.  Musculoskeletal: Pain on moving right hip.  Psychiatric: Appears normal.  Neurologic: Alert awake oriented to time place and person. Moves all extremities.  Labs on Admission:  Basic Metabolic Panel:  Recent Labs Lab 10/31/14 0102  NA 141  K 3.7  CL 110  CO2 25  GLUCOSE 146*  BUN 22*  CREATININE 1.05  CALCIUM 8.9   Liver Function Tests: No results for input(s): AST, ALT, ALKPHOS, BILITOT, PROT, ALBUMIN in the last 168 hours. No results for input(s): LIPASE, AMYLASE in the last 168 hours. No results for input(s): AMMONIA in the last 168 hours. CBC:  Recent Labs Lab 10/31/14 0102  WBC 9.6  NEUTROABS 7.4  HGB 12.8*  HCT 38.3*  MCV 102.7*  PLT 202   Cardiac  Enzymes: No results for input(s): CKTOTAL, CKMB, CKMBINDEX, TROPONINI in the last 168 hours.  BNP (last 3 results) No results for input(s): BNP in the last 8760 hours.  ProBNP (last 3 results) No results for input(s): PROBNP in the last 8760 hours.  CBG: No results for input(s): GLUCAP in the last 168 hours.  Radiological Exams on Admission: Dg Tibia/fibula Right  10/31/2014   CLINICAL DATA:  Twisting injury to right leg, with right leg pain and numbness. Limited range of motion. Initial encounter.  EXAM: RIGHT TIBIA AND FIBULA - 2 VIEW  COMPARISON:  None.  FINDINGS: There is no evidence of fracture or dislocation. The tibia and fibula appear grossly  intact. The knee joint is grossly unremarkable in appearance. No knee joint effusion is characterized. The ankle mortise is incompletely assessed, but appears grossly unremarkable. No definite soft tissue abnormalities are characterized on radiograph.  IMPRESSION: No evidence of fracture or dislocation.   Electronically Signed   By: Garald Balding M.D.   On: 10/31/2014 00:44   Dg Chest Port 1 View  10/31/2014   CLINICAL DATA:  81 year old male with fall.  Preop chest radiograph  EXAM: PORTABLE CHEST - 1 VIEW  COMPARISON:  None.  FINDINGS: There is diffuse bilateral interstitial and vascular prominence. A 5 mm nodular density is noted the left mid lung field. Bibasilar linear atelectasis/ scarring noted. There is no focal consolidation, pleural effusion, or pneumothorax. Mild cardiomegaly. Degenerative changes of spine.  IMPRESSION: No focal consolidation.  5 mm left mid lung field nodule with   Electronically Signed   By: Anner Crete M.D.   On: 10/31/2014 01:35   Dg Knee Complete 4 Views Right  10/31/2014   CLINICAL DATA:  81 year old male with right leg twisting and pain.  EXAM: RIGHT KNEE - COMPLETE 4+ VIEW  COMPARISON:  None.  FINDINGS: There is no evidence of fracture, dislocation, or joint effusion. There is no evidence of arthropathy or other focal bone abnormality. Soft tissues are unremarkable.  IMPRESSION: No fracture or dislocation.   Electronically Signed   By: Anner Crete M.D.   On: 10/31/2014 00:42   Dg Hip Unilat With Pelvis 2-3 Views Right  10/31/2014   CLINICAL DATA:  81 year old male with right leg twisting and pain.  EXAM: DG HIP (WITH OR WITHOUT PELVIS) 2-3V RIGHT  COMPARISON:  None.  FINDINGS: There is a minimally displaced fracture of the right femoral neck. There were head of the right femur is in anatomic alignment with the acetabulum. No other fracture identified. There is degenerative changes of the lower lumbar spine. Multiple surgical clips noted in the pelvis.   IMPRESSION: Right femoral neck fracture.   Electronically Signed   By: Anner Crete M.D.   On: 10/31/2014 00:41   Dg Femur, Min 2 Views Right  10/31/2014   CLINICAL DATA:  81 year old male with right leg twisting and pain.  EXAM: RIGHT FEMUR 2 VIEWS  COMPARISON:  None.  FINDINGS: There is no fracture or dislocation of the visualized right femoral shaft. The right femoral neck fracture seen on the hip radiograph is not included in this study. The soft tissues are grossly unremarkable.  IMPRESSION: No fracture of the visualized femoral diaphysis.   Electronically Signed   By: Anner Crete M.D.   On: 10/31/2014 00:44    EKG: Independently reviewed. Normal sinus rhythm.  Assessment/Plan Principal Problem:   Femoral neck fracture Active Problems:   Macrocytic anemia   Lung nodule   Hip fracture  1. Right hip fracture status post mechanical fall - for medical standpoint of view patient is low risk for intermediate risk surgery. Patient will be kept nothing by mouth in anticipation of surgery with pain related medications. 2. Lung nodule - will need follow-up as outpatient for further workup. 3. Macrocytic anemia - follow CBC and further workup as outpatient. 4. History of prostate cancer status post prostatectomy.  Patient will be transferred to [facility]. Patient is agreeable to transfer. Dr. Alcario Drought will be the accepting physician.   DVT Prophylaxis SCDs anticipation of surgery.  Code Status: Full code.  Family Communication: Discussed with patient.  Disposition Plan: Admit to inpatient.    Ashyr Hedgepath N. [facility] Pager [phone].  If 7PM-7AM, please contact night-coverage [email or web address] Password [facility] 10/31/2014, 3:16 AM

## 2014-10-31 NOTE — Progress Notes (Signed)
Orthopedic Tech Progress Note Patient Details:  Marcus Duran [birth year] [id number]  Patient ID: [name], male   DOB: 1928/01/02, 81 y.o.   MRN: [id number] Pt unable to trapeze bar patient helper  Hildred Priest 10/31/2014, 6:09 AM

## 2014-10-31 NOTE — Consult Note (Signed)
Reason for Consult: Displaced right femoral neck fracture Referring Physician: [name] is an 81 y.o. male.  HPI: Community ambulator who was getting out of a truck yesterday miss the second step and fell on his right hip sustaining a displaced impacted right femoral neck fracture. 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 He denies any other injuries. No history of prior heart disease blood clots or pulmonary emboli has had surgery in the past no problems with anesthesia. He denies any other injuries he is alert and active at age 51. He did have a rattle O prostatectomy some years ago and again no problems with the surgery or anesthesia.  Past Medical History  Diagnosis Date  . Prostate cancer     Past Surgical History  Procedure Laterality Date  . Prostatectomy    . Cholecystectomy    . Hernia repair      Family History  Problem Relation Age of Onset  . Hypertension Neg Hx   . Diabetes Mellitus II Neg Hx     Social History:  reports that he has never smoked. He does not have any smokeless tobacco history on file. He reports that he does not drink alcohol or use illicit drugs.  Allergies: No Known Allergies  Medications: I have reviewed the patient's current medications.  Results for orders placed or performed during the hospital encounter of 10/30/14 (from the past 48 hour(s))  Basic metabolic panel     Status: Abnormal   Collection Time: 10/31/14  1:02 AM  Result Value Ref Range   Sodium 141 135 - 145 mmol/L   Potassium 3.7 3.5 - 5.1 mmol/L   Chloride 110 101 - 111 mmol/L   CO2 25 22 - 32 mmol/L   Glucose, Bld 146 (H) 65 - 99 mg/dL   BUN 22 (H) 6 - 20 mg/dL   Creatinine, Ser 1.05 0.61 - 1.24 mg/dL   Calcium 8.9 8.9 - 10.3 mg/dL   GFR calc non Af Amer >60 >60 mL/min   GFR calc Af Amer >60 >60 mL/min    Comment: (NOTE) The eGFR has been calculated using the CKD EPI  equation. This calculation has not been validated in all clinical situations. eGFR's persistently <60 mL/min signify possible Chronic Kidney Disease.    Anion gap 6 5 - 15  CBC with Differential     Status: Abnormal   Collection Time: 10/31/14  1:02 AM  Result Value Ref Range   WBC 9.6 4.0 - 10.5 K/uL   RBC 3.73 (L) 4.22 - 5.81 MIL/uL   Hemoglobin 12.8 (L) 13.0 - 17.0 g/dL   HCT 38.3 (L) 39.0 - 52.0 %   MCV 102.7 (H) 78.0 - 100.0 fL   MCH 34.3 (H) 26.0 - 34.0 pg   MCHC 33.4 30.0 - 36.0 g/dL   RDW 13.7 11.5 - 15.5 %   Platelets 202 150 - 400 K/uL   Neutrophils Relative % 77 43 - 77 %   Neutro Abs 7.4 1.7 - 7.7 K/uL   Lymphocytes Relative 15 12 - 46 %   Lymphs Abs 1.5 0.7 - 4.0 K/uL   Monocytes Relative 6 3 - 12 %   Monocytes Absolute 0.6 0.1 - 1.0 K/uL   Eosinophils Relative 2 0 - 5 %   Eosinophils Absolute 0.1 0.0 - 0.7 K/uL   Basophils Relative 0 0 - 1 %  Basophils Absolute 0.0 0.0 - 0.1 K/uL  Protime-INR     Status: None   Collection Time: 10/31/14  1:02 AM  Result Value Ref Range   Prothrombin Time 15.2 11.6 - 15.2 seconds   INR 1.18 0.00 - 1.49    Dg Tibia/fibula Right  10/31/2014   CLINICAL DATA:  Twisting injury to right leg, with right leg pain and numbness. Limited range of motion. Initial encounter.  EXAM: RIGHT TIBIA AND FIBULA - 2 VIEW  COMPARISON:  None.  FINDINGS: There is no evidence of fracture or dislocation. The tibia and fibula appear grossly intact. The knee joint is grossly unremarkable in appearance. No knee joint effusion is characterized. The ankle mortise is incompletely assessed, but appears grossly unremarkable. No definite soft tissue abnormalities are characterized on radiograph.  IMPRESSION: No evidence of fracture or dislocation.   Electronically Signed   By: Garald Balding M.D.   On: 10/31/2014 00:44   Dg Chest Port 1 View  10/31/2014   CLINICAL DATA:  81 year old male with fall.  Preop chest radiograph  EXAM: PORTABLE CHEST - 1 VIEW  COMPARISON:   None.  FINDINGS: There is diffuse bilateral interstitial and vascular prominence. A 5 mm nodular density is noted the left mid lung field. Bibasilar linear atelectasis/ scarring noted. There is no focal consolidation, pleural effusion, or pneumothorax. Mild cardiomegaly. Degenerative changes of spine.  IMPRESSION: No focal consolidation.  5 mm left mid lung field nodule with   Electronically Signed   By: Anner Crete M.D.   On: 10/31/2014 01:35   Dg Knee Complete 4 Views Right  10/31/2014   CLINICAL DATA:  81 year old male with right leg twisting and pain.  EXAM: RIGHT KNEE - COMPLETE 4+ VIEW  COMPARISON:  None.  FINDINGS: There is no evidence of fracture, dislocation, or joint effusion. There is no evidence of arthropathy or other focal bone abnormality. Soft tissues are unremarkable.  IMPRESSION: No fracture or dislocation.   Electronically Signed   By: Anner Crete M.D.   On: 10/31/2014 00:42   Dg Hip Unilat With Pelvis 2-3 Views Right  10/31/2014   CLINICAL DATA:  81 year old male with right leg twisting and pain.  EXAM: DG HIP (WITH OR WITHOUT PELVIS) 2-3V RIGHT  COMPARISON:  None.  FINDINGS: There is a minimally displaced fracture of the right femoral neck. There were head of the right femur is in anatomic alignment with the acetabulum. No other fracture identified. There is degenerative changes of the lower lumbar spine. Multiple surgical clips noted in the pelvis.  IMPRESSION: Right femoral neck fracture.   Electronically Signed   By: Anner Crete M.D.   On: 10/31/2014 00:41   Dg Femur, Min 2 Views Right  10/31/2014   CLINICAL DATA:  81 year old male with right leg twisting and pain.  EXAM: RIGHT FEMUR 2 VIEWS  COMPARISON:  None.  FINDINGS: There is no fracture or dislocation of the visualized right femoral shaft. The right femoral neck fracture seen on the hip radiograph is not included in this study. The soft tissues are grossly unremarkable.  IMPRESSION: No fracture of the visualized  femoral diaphysis.   Electronically Signed   By: Anner Crete M.D.   On: 10/31/2014 00:44    ROS   denies any chest pain shortness of breath and again no history of blood clots or pulmonary emboli   Blood pressure 110/62, pulse 59, temperature 97.4 F (36.3 C), temperature source Oral, resp. rate 18, height $RemoveBe'5\' 10"'bmbrJKkgO$  (1.778 m),  weight 71.668 kg (158 lb), SpO2 98 %.   Physical Exam  Tender to palpation over the right greater trochanter any attempted internal or external rotation of the hip causes significant discomfort the right hip is 1/2 inch short and 40 externally rotated in relation of the left hip. Normal pulses to the foot normal sensation of the foot dorsiflexors and plantar flexors are intact. There are no cuts scrapes or abrasions to the skin. Assessment/Plan:  Assessment: Displaced and impacted right femoral neck fracture in an 81 year old man who is otherwise relatively healthy.  Plan: We will proceed with hemiarthroplasty risks and benefits of surgery discussed with the patient. At age 43 he'll probably need to stay and rehabilitation. His plan is to return to [place] once he is healed.  Marcus Duran 10/31/2014, 6:47 AM

## 2014-10-31 NOTE — Progress Notes (Signed)
Initial Nutrition Assessment  DOCUMENTATION CODES:   Non-severe (moderate) malnutrition in context of chronic illness  Pt meets criteria for MODERATE MALNUTRITION in the context of chronic illness as evidenced by moderate fat and muscle mass loss.  INTERVENTION:   Provide Ensure Enlive po once daily, each supplement provides 350 kcal and 20 grams of protein once diet advances.  NUTRITION DIAGNOSIS:   Increased nutrient needs related to  (surgery) as evidenced by estimated needs  GOAL:   Patient will meet greater than or equal to 90% of their needs  MONITOR:   Diet advancement, Weight trends, Labs, I & O's  REASON FOR ASSESSMENT:   Consult Hip fracture protocol  ASSESSMENT:   81 y.o. male with previous history of prostate cancer presents to the ER after patient had a fall after tripping from a truck. x-rays revealed a right hip fracture.   Pt is currently NPO for surgery today. Pt reports have a good appetite PTA with consumption of 3 meals a day along with a protein shake once daily. Weight has been stable. Pt is agreeable to Ensure once diet advances to aid in healing. RD to order.   Nutrition-Focused physical exam completed. Findings are moderate fat depletion, moderate muscle depletion, and no edema.   Labs and medications reviewed.   Diet Order:  Diet NPO time specified  Skin:  Reviewed, no issues  Last BM:  7/17  Height:   Ht Readings from Last 1 Encounters:  10/30/14 5\' 10"  (1.778 m)    Weight:   Wt Readings from Last 1 Encounters:  10/30/14 158 lb (71.668 kg)    Ideal Body Weight:  75.45 kg  Wt Readings from Last 10 Encounters:  10/30/14 158 lb (71.668 kg)    BMI:  Body mass index is 22.67 kg/(m^2).  Estimated Nutritional Needs:   Kcal:  1850-2050  Protein:  85-95 grams  Fluid:  1.8 - 2.1 L/day  EDUCATION NEEDS:   No education needs identified at this time  Corrin Parker, MS, RD, LDN Pager # [phone] After hours/ weekend pager #  [phone]

## 2014-11-01 ENCOUNTER — Encounter (HOSPITAL_COMMUNITY): Payer: Self-pay | Admitting: Orthopedic Surgery

## 2014-11-01 LAB — CBC
HEMATOCRIT: 33.6 % — AB (ref 39.0–52.0)
Hemoglobin: 11.2 g/dL — ABNORMAL LOW (ref 13.0–17.0)
MCH: 33.9 pg (ref 26.0–34.0)
MCHC: 33.3 g/dL (ref 30.0–36.0)
MCV: 101.8 fL — AB (ref 78.0–100.0)
PLATELETS: 181 10*3/uL (ref 150–400)
RBC: 3.3 MIL/uL — AB (ref 4.22–5.81)
RDW: 13.6 % (ref 11.5–15.5)
WBC: 8.1 10*3/uL (ref 4.0–10.5)

## 2014-11-01 LAB — URINE CULTURE

## 2014-11-01 NOTE — Progress Notes (Signed)
Patient ID: Marcus Duran, male   DOB: 1927-08-14, 81 y.o.   MRN: [id number] PATIENT ID: Marcus Duran  MRN: [id number]  DOB/AGE:  [birth year] / 81 y.o.  1 Day Post-Op Procedure(s) (LRB): Right  ARTHROPLASTY BIPOLAR HIP (HEMIARTHROPLASTY) (Right)    PROGRESS NOTE Subjective: Patient is alert, oriented, no Nausea, no Vomiting, yes passing gas, no Bowel Movement. Taking PO well. Denies SOB, Chest or Calf Pain. Using Incentive Spirometer, PAS in place. Ambulate WBAT Patient reports pain as 3 on 0-10 scale  .    Objective: Vital signs in last 24 hours: Filed Vitals:   10/31/14 2141 10/31/14 2329 11/01/14 0051 11/01/14 0425  BP:  87/53 108/59 120/61  Pulse:   57 66  Temp:   98.4 F (36.9 C) 98.9 F (37.2 C)  TempSrc:    Oral  Resp:   19 18  Height:      Weight:      SpO2: 96%  100% 99%      Intake/Output from previous day: I/O last 3 completed shifts: In: 2862.5 [I.V.:2862.5] Out: 825 [Urine:625; Blood:250]   Intake/Output this shift:     LABORATORY DATA:  Recent Labs  10/31/14 0102 10/31/14 0712 11/01/14 0430  WBC 9.6 7.0 8.1  HGB 12.8* 12.0* 11.2*  HCT 38.3* 35.6* 33.6*  PLT 202 174 181  NA 141 142  --   K 3.7 3.9  --   CL 110 110  --   CO2 25 28  --   BUN 22* 16  --   CREATININE 1.05 1.05  --   GLUCOSE 146* 107*  --   INR 1.18  --   --   CALCIUM 8.9 8.9  --     Examination: Neurologically intact ABD soft Neurovascular intact Sensation intact distally Intact pulses distally Dorsiflexion/Plantar flexion intact Incision: no drainage No cellulitis present Compartment soft} XR AP&Lat of hip shows well placed\fixed THA  Assessment:   1 Day Post-Op Procedure(s) (LRB): Right  ARTHROPLASTY BIPOLAR HIP (HEMIARTHROPLASTY) (Right) ADDITIONAL DIAGNOSIS:  Expected Acute Blood Loss Anemia,   Plan: PT/OT WBAT, THA  posterior precautions  DVT Prophylaxis: SCDx72 hrs, ASA 325 mg BID x 2 weeks  DISCHARGE PLAN: Skilled Nursing Facility/Rehab  DISCHARGE  NEEDS: HHPT, Walker and 3-in-1 comode seat

## 2014-11-01 NOTE — Evaluation (Signed)
Physical Therapy Evaluation Patient Details Name: Marcus Duran MRN: [id number] DOB: [birth year] Today's Date: 11/01/2014   History of Present Illness  Community ambulator who resides in [place] who was getting out of a truck yesterday miss the second step and fell on his right hip sustaining a displaced impacted right femoral neck fracture. Pt now s/p R  bipolar hip arthroplasty(7/18) with posterior hip precautions.  Clinical Impression  Pt admitted with above diagnosis. Pt currently with functional limitations due to the deficits listed below (see PT Problem List).  Pt will benefit from skilled PT to increase their independence and safety with mobility to allow discharge to the venue listed below.  Pt limited by 10/10 R hip pain with mobility, although little pain at rest.   Pt educated on WB status and hip precautions with pt recalling 1/3 of them at end of session.  Pt is from [place] and would benefit from short term SNF placement for rehab prior to returning home.  Pt and son are in agreement with this plan.     Follow Up Recommendations SNF    Equipment Recommendations  None recommended by PT    Recommendations for Other Services       Precautions / Restrictions Precautions Precautions: Posterior Hip Precaution Booklet Issued: Yes (comment) Precaution Comments: Reviewed with patient and son Restrictions RLE Weight Bearing: Weight bearing as tolerated      Mobility  Bed Mobility Overal bed mobility: Needs Assistance Bed Mobility: Supine to Sit     Supine to sit: Mod assist     General bed mobility comments: Pt moving slowly and needing multi-modal cueing to get to EOB with cueing to use L LE to assist in turning hip and then needed increased A to get trunk upright.  Some difficulty scooting to EOB.  Transfers Overall transfer level: Needs assistance Equipment used: Rolling walker (2 wheeled) Transfers: Sit to/from Stand Sit to Stand: +2 physical assistance;Mod assist;From  elevated surface         General transfer comment: Cues for proper hand placement and how to transition to RW.   Ambulation/Gait Ambulation/Gait assistance: +2 physical assistance;Max assist Ambulation Distance (Feet): 2 Feet Assistive device: Rolling walker (2 wheeled) Gait Pattern/deviations: Decreased stance time - right;Step-to pattern;Decreased step length - right;Decreased step length - left;Antalgic     General Gait Details: Pt able to take a few steps with increased R knee flexion during stance phase despite cueing to increase use of UE.  Recliner had to be brought to patient for safe completion of transfer.  Stairs            Wheelchair Mobility    Modified Rankin (Stroke Patients Only)       Balance Overall balance assessment: Needs assistance Sitting-balance support: Feet supported Sitting balance-Leahy Scale: Fair       Standing balance-Leahy Scale: Poor Standing balance comment: Requires RW and puts little weight through R LE due to pain                             Pertinent Vitals/Pain Pain Assessment: 0-10 Pain Score: 10-Worst pain ever Pain Location: R hip with any mobility. Reports no pain at rest. Pain Descriptors / Indicators: Operative site guarding;Grimacing;Guarding Pain Intervention(s): Limited activity within patient's tolerance;Monitored during session;Patient requesting pain meds-RN notified O2 in 90s' throughout session on RA    Home Living Family/patient expects to be discharged to:: Skilled nursing facility Living Arrangements: Alone  Prior Function Level of Independence: Independent         Comments: Pt lives in [place] and was in [place] working at an [employer].     Hand Dominance        Extremity/Trunk Assessment   Upper Extremity Assessment: Defer to OT evaluation           Lower Extremity Assessment: RLE deficits/detail RLE Deficits / Details: LImited ROM and strength due to pain     Cervical / Trunk Assessment: Normal  Communication   Communication: No difficulties  Cognition Arousal/Alertness: Awake/alert Behavior During Therapy: WFL for tasks assessed/performed;Flat affect Overall Cognitive Status: Within Functional Limits for tasks assessed       Memory: Decreased recall of precautions (recalled 1/3 precautions at end of session)              General Comments General comments (skin integrity, edema, etc.): Son in at end of session. Reviewed hip precautions with him and discussed d/c planning with short term SNF placement being best option.    Exercises Total Joint Exercises Ankle Circles/Pumps: AROM;Both;5 reps Heel Slides: AAROM;Right;5 reps Hip ABduction/ADduction: AAROM;Right;5 reps      Assessment/Plan    PT Assessment Patient needs continued PT services  PT Diagnosis Difficulty walking;Acute pain;Generalized weakness   PT Problem List Decreased strength;Decreased range of motion;Decreased balance;Decreased mobility;Pain;Decreased knowledge of precautions;Decreased knowledge of use of DME  PT Treatment Interventions DME instruction;Gait training;Functional mobility training;Therapeutic activities;Therapeutic exercise;Balance training;Patient/family education   PT Goals (Current goals can be found in the Care Plan section) Acute Rehab PT Goals Patient Stated Goal: To go to rehab and get stronger before returning to [place] PT Goal Formulation: With patient/family Time For Goal Achievement: 11/08/14 Potential to Achieve Goals: Good    Frequency Min 3X/week   Barriers to discharge        Co-evaluation               End of Session Equipment Utilized During Treatment: Gait belt Activity Tolerance: Patient limited by pain Patient left: in chair;with call bell/phone within reach;with nursing/sitter in room;with family/visitor present Nurse Communication: Mobility status;Patient requests pain meds;Weight bearing status;Precautions          Time: 1000-1029 PT Time Calculation (min) (ACUTE ONLY): 29 min   Charges:   PT Evaluation $Initial PT Evaluation Tier I: 1 Procedure PT Treatments $Therapeutic Activity: 8-22 mins   PT G Codes:        Neena Beecham LUBECK 11/01/2014, 12:16 PM

## 2014-11-01 NOTE — Progress Notes (Signed)
Patient Demographics:    Deston Bilyeu, is a 81 y.o. male, DOB - [birth year], LJQ:[id number]  Admit date - 10/30/2014   Admitting Physician Rise Patience, MD  Outpatient Primary MD for the patient is No primary care provider on file.  LOS - 1   Chief Complaint  Patient presents with  . Fall  . Ankle Injury        Subjective:    Christean Grief Grayer today has, No headache, No chest pain, No abdominal pain - No Nausea, No new weakness tingling or numbness, No Cough - SOB. Mild right-sided hip pain. Dull nonradiating.   Assessment  & Plan :     1. Mechanical fall with right femoral neck fracture. Orthopedics consulted, post open reduction internal fixation by orthopedics on 10/31/2014, per ortho SCDx72 hrs, ASA 325 mg BID x 2 weeks for DVT prophylaxis, weightbearing as tolerated on the right leg. Start PT. Patient is from [place] will prefer home discharge if possible. Discussed with son bedside.    2. Lung nodule and macrocytic anemia. Will need outpatient workup by PCP.   3. History of prostate cancer. Status post prostatectomy. Outpatient follow-up with primary oncologist and PCP post discharge.    Code Status : Full  Family Communication  : Son bedside.  Disposition Plan  : To be decided postop PT to evaluate.  Consults  :  Orthopedics  Procedures  : ORIF 10/31/2014 right femur  DVT Prophylaxis  : Per Ortho SCDx72 hrs, ASA 325 mg BID x 2 weeks   Lab Results  Component Value Date   PLT 181 11/01/2014    Inpatient Medications  Scheduled Meds: . aspirin EC  325 mg Oral Q breakfast  . docusate sodium  100 mg Oral BID  . feeding supplement (ENSURE ENLIVE)  237 mL Oral Daily   Continuous Infusions: . dextrose 5 % and 0.45% NaCl 75 mL/hr at 10/31/14 0630  . lactated ringers 50  mL/hr at 10/31/14 1513   PRN Meds:.acetaminophen **OR** acetaminophen, alum & mag hydroxide-simeth, bisacodyl, diphenhydrAMINE, HYDROcodone-acetaminophen, HYDROmorphone (DILAUDID) injection, metoprolol, [DISCONTINUED] ondansetron **OR** ondansetron (ZOFRAN) IV, oxyCODONE, senna-docusate, zolpidem  Antibiotics  :     Anti-infectives    Start     Dose/Rate Route Frequency Ordered Stop   10/31/14 1810  cefUROXime (ZINACEF) injection  Status:  Discontinued       As needed 10/31/14 1810 10/31/14 2027   10/31/14 1400  ceFAZolin (ANCEF) IVPB 2 g/50 mL premix     2 g 100 mL/hr over 30 Minutes Intravenous To Surgery 10/31/14 1030 10/31/14 1650   10/31/14 0700  ceFAZolin (ANCEF) IVPB 2 g/50 mL premix  Status:  Discontinued     2 g 100 mL/hr over 30 Minutes Intravenous 3 times per day 10/31/14 0653 10/31/14 1030        Objective:   Filed Vitals:   10/31/14 2141 10/31/14 2329 11/01/14 0051 11/01/14 0425  BP:  87/53 108/59 120/61  Pulse:   57 66  Temp:   98.4 F (36.9 C) 98.9 F (37.2 C)  TempSrc:    Oral  Resp:   19 18  Height:      Weight:      SpO2: 96%  100% 99%    Wt Readings from  Last 3 Encounters:  10/31/14 71.668 kg (158 lb)     Intake/Output Summary (Last 24 hours) at 11/01/14 1107 Last data filed at 11/01/14 0945  Gross per 24 hour  Intake 3102.5 ml  Output    875 ml  Net 2227.5 ml     Physical Exam  Awake Alert, Oriented X 3, No new F.N deficits, Normal affect [place].AT,PERRAL Supple Neck,No JVD, No cervical lymphadenopathy appriciated.  Symmetrical Chest wall movement, Good air movement bilaterally, CTAB RRR,No Gallops,Rubs or new Murmurs, No Parasternal Heave +ve B.Sounds, Abd Soft, No tenderness, No organomegaly appriciated, No rebound - guarding or rigidity. No Cyanosis, Clubbing or edema, No new Rash or bruise  , right hip postop scar stable.    Data Review:   Micro Results Recent Results (from the past 240 hour(s))  MRSA PCR Screening     Status: None     Collection Time: 10/31/14  6:52 AM  Result Value Ref Range Status   MRSA by PCR NEGATIVE NEGATIVE Final    Comment:        The GeneXpert MRSA Assay (FDA approved for NASAL specimens only), is one component of a comprehensive MRSA colonization surveillance program. It is not intended to diagnose MRSA infection nor to guide or monitor treatment for MRSA infections.     Radiology Reports Dg Tibia/fibula Right  10/31/2014   CLINICAL DATA:  Twisting injury to right leg, with right leg pain and numbness. Limited range of motion. Initial encounter.  EXAM: RIGHT TIBIA AND FIBULA - 2 VIEW  COMPARISON:  None.  FINDINGS: There is no evidence of fracture or dislocation. The tibia and fibula appear grossly intact. The knee joint is grossly unremarkable in appearance. No knee joint effusion is characterized. The ankle mortise is incompletely assessed, but appears grossly unremarkable. No definite soft tissue abnormalities are characterized on radiograph.  IMPRESSION: No evidence of fracture or dislocation.   Electronically Signed   By: Garald Balding M.D.   On: 10/31/2014 00:44   Dg Pelvis Portable  11/01/2014   CLINICAL DATA:  Status post right total hip arthroplasty. Initial encounter.  EXAM: PORTABLE PELVIS 1-2 VIEWS  COMPARISON:  Right hip radiographs performed 10/31/2014  FINDINGS: There is no evidence of new fracture or dislocation. The patient's right hip arthroplasty is grossly unremarkable. There is no evidence of loosening. Overlying soft tissue air is noted. Scattered apparent soft tissue calcifications are noted overlying the mid right femur.  Postoperative change is noted about the lower pelvis. The sacroiliac joints are grossly unremarkable. The left hip joint is within normal limits.  IMPRESSION: No evidence of new fracture or dislocation. Right hip arthroplasty is grossly unremarkable, without evidence of loosening.   Electronically Signed   By: Garald Balding M.D.   On: 11/01/2014 01:59    Dg Chest Port 1 View  10/31/2014   CLINICAL DATA:  81 year old male with fall.  Preop chest radiograph  EXAM: PORTABLE CHEST - 1 VIEW  COMPARISON:  None.  FINDINGS: There is diffuse bilateral interstitial and vascular prominence. A 5 mm nodular density is noted the left mid lung field. Bibasilar linear atelectasis/ scarring noted. There is no focal consolidation, pleural effusion, or pneumothorax. Mild cardiomegaly. Degenerative changes of spine.  IMPRESSION: No focal consolidation.  5 mm left mid lung field nodule with   Electronically Signed   By: Anner Crete M.D.   On: 10/31/2014 01:35   Dg Knee Complete 4 Views Right  10/31/2014   CLINICAL DATA:  81 year old male with  right leg twisting and pain.  EXAM: RIGHT KNEE - COMPLETE 4+ VIEW  COMPARISON:  None.  FINDINGS: There is no evidence of fracture, dislocation, or joint effusion. There is no evidence of arthropathy or other focal bone abnormality. Soft tissues are unremarkable.  IMPRESSION: No fracture or dislocation.   Electronically Signed   By: Anner Crete M.D.   On: 10/31/2014 00:42   Dg C-arm 61-120 Min-no Report  10/31/2014   CLINICAL DATA: surgery   C-ARM 61-120 MINUTES  Fluoroscopy was utilized by the requesting physician.  No radiographic  interpretation.    Dg Hip Unilat With Pelvis 2-3 Views Right  10/31/2014   CLINICAL DATA:  81 year old male with right leg twisting and pain.  EXAM: DG HIP (WITH OR WITHOUT PELVIS) 2-3V RIGHT  COMPARISON:  None.  FINDINGS: There is a minimally displaced fracture of the right femoral neck. There were head of the right femur is in anatomic alignment with the acetabulum. No other fracture identified. There is degenerative changes of the lower lumbar spine. Multiple surgical clips noted in the pelvis.  IMPRESSION: Right femoral neck fracture.   Electronically Signed   By: Anner Crete M.D.   On: 10/31/2014 00:41   Dg Femur, Min 2 Views Right  10/31/2014   CLINICAL DATA:  81 year old male with  right leg twisting and pain.  EXAM: RIGHT FEMUR 2 VIEWS  COMPARISON:  None.  FINDINGS: There is no fracture or dislocation of the visualized right femoral shaft. The right femoral neck fracture seen on the hip radiograph is not included in this study. The soft tissues are grossly unremarkable.  IMPRESSION: No fracture of the visualized femoral diaphysis.   Electronically Signed   By: Anner Crete M.D.   On: 10/31/2014 00:44     CBC  Recent Labs Lab 10/31/14 0102 10/31/14 0712 11/01/14 0430  WBC 9.6 7.0 8.1  HGB 12.8* 12.0* 11.2*  HCT 38.3* 35.6* 33.6*  PLT 202 174 181  MCV 102.7* 101.4* 101.8*  MCH 34.3* 34.2* 33.9  MCHC 33.4 33.7 33.3  RDW 13.7 13.8 13.6  LYMPHSABS 1.5 1.4  --   MONOABS 0.6 0.6  --   EOSABS 0.1 0.1  --   BASOSABS 0.0 0.0  --     Chemistries   Recent Labs Lab 10/31/14 0102 10/31/14 0712  NA 141 142  K 3.7 3.9  CL 110 110  CO2 25 28  GLUCOSE 146* 107*  BUN 22* 16  CREATININE 1.05 1.05  CALCIUM 8.9 8.9  AST  --  22  ALT  --  16*  ALKPHOS  --  60  BILITOT  --  0.6   ------------------------------------------------------------------------------------------------------------------ estimated creatinine clearance is 52.2 mL/min (by C-G formula based on Cr of 1.05). ------------------------------------------------------------------------------------------------------------------ No results for input(s): HGBA1C in the last 72 hours. ------------------------------------------------------------------------------------------------------------------ No results for input(s): CHOL, HDL, LDLCALC, TRIG, CHOLHDL, LDLDIRECT in the last 72 hours. ------------------------------------------------------------------------------------------------------------------ No results for input(s): TSH, T4TOTAL, T3FREE, THYROIDAB in the last 72 hours.  Invalid input(s):  FREET3 ------------------------------------------------------------------------------------------------------------------ No results for input(s): VITAMINB12, FOLATE, FERRITIN, TIBC, IRON, RETICCTPCT in the last 72 hours.  Coagulation profile  Recent Labs Lab 10/31/14 0102  INR 1.18    No results for input(s): DDIMER in the last 72 hours.  Cardiac Enzymes No results for input(s): CKMB, TROPONINI, MYOGLOBIN in the last 168 hours.  Invalid input(s): CK ------------------------------------------------------------------------------------------------------------------ Invalid input(s): POCBNP   Time Spent in minutes   35   SINGH,PRASHANT K M.D on 11/01/2014 at 11:07 AM  Between 7am to 7pm - Pager - [phone]  After 7pm go to [email or web address] - password [facility]  [facility] -  Office  [phone]

## 2014-11-01 NOTE — Progress Notes (Signed)
OT Cancellation Note  Patient Details Name: Marcus Duran MRN: [id number] DOB: Oct 30, 1927   Cancelled Treatment:    Reason Eval/Treat Not Completed: Screened. Pt is Medicare and current D/C plan is SNF. No apparent immediate acute care OT needs, therefore will defer OT to SNF. If OT eval is needed please call Acute Rehab Dept. at [phone] or text page OT at [phone].    Benito Mccreedy OTR/L [phone] 11/01/2014, 3:17 PM

## 2014-11-02 DIAGNOSIS — R911 Solitary pulmonary nodule: Secondary | ICD-10-CM

## 2014-11-02 DIAGNOSIS — S72001A Fracture of unspecified part of neck of right femur, initial encounter for closed fracture: Principal | ICD-10-CM

## 2014-11-02 LAB — HEMOGLOBIN AND HEMATOCRIT, BLOOD
HEMATOCRIT: 28.2 % — AB (ref 39.0–52.0)
HEMOGLOBIN: 9.8 g/dL — AB (ref 13.0–17.0)

## 2014-11-02 MED ORDER — TIZANIDINE HCL 2 MG PO CAPS: 2.0000 mg | ORAL_CAPSULE | Freq: Three times a day (TID) | ORAL | Status: AC | PRN

## 2014-11-02 MED ORDER — ASPIRIN EC 325 MG PO TBEC: 325.0000 mg | DELAYED_RELEASE_TABLET | Freq: Two times a day (BID) | ORAL | Status: AC

## 2014-11-02 MED ORDER — OXYCODONE-ACETAMINOPHEN 5-325 MG PO TABS: 1.0000 | ORAL_TABLET | Freq: Four times a day (QID) | ORAL | Status: AC | PRN
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
## 2014-11-02 NOTE — Clinical Social Work Placement (Signed)
   CLINICAL SOCIAL WORK PLACEMENT  NOTE  Date:  11/02/2014  Patient Details  Name: Marcus Duran MRN: [id number] Date of Birth: 1927/08/17  Clinical Social Work is seeking post-discharge placement for this patient at the [place] level of care (*CSW will initial, date and re-position this form in  chart as items are completed):  Yes   Patient/family provided with [facility] Work Department's list of facilities offering this level of care within the geographic area requested by the patient (or if unable, by the patient's family).  Yes   Patient/family informed of their freedom to choose among providers that offer the needed level of care, that participate in Medicare, Medicaid or managed care program needed by the patient, have an available bed and are willing to accept the patient.  Yes   Patient/family informed of [facility]'s ownership interest in [facility] and [facility], as well as of the fact that they are under no obligation to receive care at these facilities.  PASRR submitted to EDS on 11/02/14     PASRR number received on 11/02/14     Existing PASRR number confirmed on  (n/a)     FL2 transmitted to all facilities in geographic area requested by pt/family on 11/02/14     FL2 transmitted to all facilities within larger geographic area on  (n/a)     Patient informed that his/her managed care company has contracts with or will negotiate with certain facilities, including the following:   (yes, [place])     Yes   Patient/family informed of bed offers received.  Patient chooses bed at [facility]     Physician recommends and patient chooses bed at [facility] (Bundle patient)    Patient to be transferred to [facility] on 11/02/14.  Patient to be transferred to facility by [facility]AR     Patient family notified on 11/02/14 of transfer.  Name of family member notified:  Patient updated at bedside.     PHYSICIAN        Additional Comment:    _______________________________________________ Caroline Sauger, LCSW 11/02/2014, 12:31 PM [phone]

## 2014-11-02 NOTE — Progress Notes (Signed)
Christean Grief Fero discharged to [facility]  per MD order. Discharge instructions reviewed and discussed with patient. All questions and concerns answered. Copy of instructions and scripts sent with patient. IV removed. Report given to nurse at [facility] Kathlee [place], [place]).  Patient escorted by [facility]. No distress noted upon discharge.   Esaw Dace 11/02/2014 4:26 PM

## 2014-11-02 NOTE — Progress Notes (Signed)
PATIENT ID: Marcus Duran  MRN: [id number]  DOB/AGE:  [birth year] / 81 y.o.  2 Days Post-Op Procedure(s) (LRB): Right  ARTHROPLASTY BIPOLAR HIP (HEMIARTHROPLASTY) (Right)    PROGRESS NOTE Subjective: Patient is alert, oriented, no Nausea, no Vomiting, yes passing gas, yes Bowel Movement. Taking PO well with pt up eating in room. Denies SOB, Chest or Calf Pain. Using Incentive Spirometer, PAS in place. Ambulate WBAT Patient reports pain as mild and moderate  .    Objective: Vital signs in last 24 hours: Filed Vitals:   11/01/14 0425 11/01/14 1445 11/01/14 2105 11/02/14 0533  BP: 120/61 116/64 102/72 109/71  Pulse: 66  77 56  Temp: 98.9 F (37.2 C) 99.1 F (37.3 C) 99 F (37.2 C) 97.7 F (36.5 C)  TempSrc: Oral Oral    Resp: 18 18 18 17   Height:      Weight:      SpO2: 99% 98% 99% 97%      Intake/Output from previous day: I/O last 3 completed shifts: In: 2993.8 [P.O.:720; I.V.:2273.8] Out: 685 [Urine:635; Blood:50]   Intake/Output this shift:     LABORATORY DATA:  Recent Labs  10/31/14 0102 10/31/14 0712 11/01/14 0430 11/02/14 0445  WBC 9.6 7.0 8.1  --   HGB 12.8* 12.0* 11.2* 9.8*  HCT 38.3* 35.6* 33.6* 28.2*  PLT 202 174 181  --   NA 141 142  --   --   K 3.7 3.9  --   --   CL 110 110  --   --   CO2 25 28  --   --   BUN 22* 16  --   --   CREATININE 1.05 1.05  --   --   GLUCOSE 146* 107*  --   --   INR 1.18  --   --   --   CALCIUM 8.9 8.9  --   --     Examination: Neurologically intact Neurovascular intact Sensation intact distally Intact pulses distally Dorsiflexion/Plantar flexion intact Incision: dressing C/D/I No cellulitis present Compartment soft} XR AP&Lat of hip shows well placed\fixed THA  Assessment:   2 Days Post-Op Procedure(s) (LRB): Right  ARTHROPLASTY BIPOLAR HIP (HEMIARTHROPLASTY) (Right) ADDITIONAL DIAGNOSIS:  Expected Acute Blood Loss Anemia,   Plan: PT/OT WBAT, THA  posterior precautions  DVT Prophylaxis: SCDx72 hrs, ASA  325 mg BID x 2 weeks  DISCHARGE PLAN: Skilled Nursing Facility/Rehab, when bed available  DISCHARGE NEEDS: HHPT, HHRN, Walker and 3-in-1 comode seat

## 2014-11-02 NOTE — Discharge Summary (Signed)
Discharge Summary  Marcus Duran JYN:[id number] DOB: 16-Mar-1928  PCP: No primary care provider on file.  Admit date: 10/30/2014 Discharge date: 11/02/2014  Time spent: >64mins  Recommendations for Outpatient Follow-up:  1. F/u with orthopedics in two weeks, ortho to repeat cbc to ensure h/h stable. 2. F/u with pmd (patient is from [place]) Dr. Hal Hope [phone]), pmd to repeat cxr to f/u with left lung nodule. Patient and son aware.  Discharge Diagnoses:  Active Hospital Problems   Diagnosis Date Noted  . Femoral neck fracture 10/31/2014  . Macrocytic anemia 10/31/2014  . Lung nodule 10/31/2014  . Hip fracture 10/31/2014  . Malnutrition of moderate degree 10/31/2014  . Primary osteoarthritis of right hip 10/31/2014    Resolved Hospital Problems   Diagnosis Date Noted Date Resolved  No resolved problems to display.    Discharge Condition: stable  Diet recommendation: heart healthy  Filed Weights   10/30/14 2318 10/31/14 1509  Weight: 71.668 kg (158 lb) 71.668 kg (158 lb)    History of present illness:  Marcus Duran is a 81 y.o. male with previous history of prostate cancer presents to the ER after patient had a fall after tripping from a truck. Patient is visiting from [place]. Patient states while getting out of the truck he slipped and fell. Denies hitting his head or losing consciousness. Denies any chest pain or shortness of breath. In the ER x-rays revealed a right hip fracture. Dr. Mayer Camel on-call orthopedic surgeon was consulted and patient will be admitted for hip fracture.   Hospital Course:  Principal Problem:   Femoral neck fracture Active Problems:   Macrocytic anemia   Lung nodule   Hip fracture   Malnutrition of moderate degree   Primary osteoarthritis of right hip  1. Mechanical fall with right femoral neck fracture.  Orthopedics consulted, post open reduction internal fixation by orthopedics on 10/31/2014,  per ortho SCDx72 hrs, ASA 325 mg  BID x 2 weeks for DVT prophylaxis, weightbearing as tolerated on the right leg.  Stable to discharge to SNF. Discussed with patient and son at bedside.   2. Lung nodule and macrocytic anemia.  Will need outpatient workup by PCP.    3. History of prostate cancer. Status post prostatectomy. Outpatient follow-up with primary oncologist and PCP post discharge.  Code Status : Full  Family Communication : Son bedside.  Disposition Plan : To be decided postop PT to evaluate.  Consults : Orthopedics  Procedures : ORIF 10/31/2014 right femur  DVT Prophylaxis : Per Ortho SCDx72 hrs, ASA 325 mg BID x 2 weeks    Discharge Exam: BP 109/71 mmHg  Pulse 56  Temp(Src) 97.7 F (36.5 C) (Oral)  Resp 17  Ht 5\' 10"  (1.778 m)  Wt 71.668 kg (158 lb)  BMI 22.67 kg/m2  SpO2 97%  General: aaox3 Cardiovascular: RRR Respiratory: CTABL  Discharge Instructions You were cared for by a hospitalist during your hospital stay. If you have any questions about your discharge medications or the care you received while you were in the hospital after you are discharged, you can call the unit and asked to speak with the hospitalist on call if the hospitalist that took care of you is not available. Once you are discharged, your primary care physician will handle any further medical issues. Please note that NO REFILLS for any discharge medications will be authorized once you are discharged, as it is imperative that you return to your primary care physician (or establish a relationship with a primary  care physician if you do not have one) for your aftercare needs so that they can reassess your need for medications and monitor your lab values.      Discharge Instructions    Diet - low sodium heart healthy    Complete by:  As directed      Increase activity slowly    Complete by:  As directed      Weight bearing as tolerated    Complete by:  As directed             Medication List    STOP taking  these medications        naproxen sodium 220 MG tablet  Commonly known as:  ANAPROX      TAKE these medications        aspirin EC 325 MG tablet  Take 1 tablet (325 mg total) by mouth 2 (two) times daily.     oxyCODONE-acetaminophen 5-325 MG per tablet  Commonly known as:  ROXICET  Take 1 tablet by mouth every 6 (six) hours as needed.     polycarbophil 625 MG tablet  Commonly known as:  FIBERCON  Take 625 mg by mouth daily.     tizanidine 2 MG capsule  Commonly known as:  ZANAFLEX  Take 1 capsule (2 mg total) by mouth 3 (three) times daily as needed for muscle spasms.     vitamin C 500 MG tablet  Commonly known as:  ASCORBIC ACID  Take 500 mg by mouth daily.       No Known Allergies Follow-up Information    Follow up with Kerin Salen, MD In 2 weeks.   Specialty:  Orthopedic Surgery   Why:  ortho to repeat cbc at follow up   Contact information:   [address] [place] [id number] [phone]       Follow up with f/u with Dr. Hal Hope .   Why:  repeat cxr to f/u on left lung nodule, repeat cbc at follow up with Dr. Hal Hope   Contact information:   Dr. Berdine Addison [phone]       The results of significant diagnostics from this hospitalization (including imaging, microbiology, ancillary and laboratory) are listed below for reference.    Significant Diagnostic Studies: Dg Tibia/fibula Right  10/31/2014   CLINICAL DATA:  Twisting injury to right leg, with right leg pain and numbness. Limited range of motion. Initial encounter.  EXAM: RIGHT TIBIA AND FIBULA - 2 VIEW  COMPARISON:  None.  FINDINGS: There is no evidence of fracture or dislocation. The tibia and fibula appear grossly intact. The knee joint is grossly unremarkable in appearance. No knee joint effusion is characterized. The ankle mortise is incompletely assessed, but appears grossly unremarkable. No definite soft tissue abnormalities are characterized on radiograph.  IMPRESSION: No  evidence of fracture or dislocation.   Electronically Signed   By: Garald Balding M.D.   On: 10/31/2014 00:44   Dg Pelvis Portable  11/01/2014   CLINICAL DATA:  Status post right total hip arthroplasty. Initial encounter.  EXAM: PORTABLE PELVIS 1-2 VIEWS  COMPARISON:  Right hip radiographs performed 10/31/2014  FINDINGS: There is no evidence of new fracture or dislocation. The patient's right hip arthroplasty is grossly unremarkable. There is no evidence of loosening. Overlying soft tissue air is noted. Scattered apparent soft tissue calcifications are noted overlying the mid right femur.  Postoperative change is noted about the lower pelvis. The sacroiliac joints are grossly unremarkable. The left hip joint  is within normal limits.  IMPRESSION: No evidence of new fracture or dislocation. Right hip arthroplasty is grossly unremarkable, without evidence of loosening.   Electronically Signed   By: Garald Balding M.D.   On: 11/01/2014 01:59   Dg Chest Port 1 View  10/31/2014   CLINICAL DATA:  81 year old male with fall.  Preop chest radiograph  EXAM: PORTABLE CHEST - 1 VIEW  COMPARISON:  None.  FINDINGS: There is diffuse bilateral interstitial and vascular prominence. A 5 mm nodular density is noted the left mid lung field. Bibasilar linear atelectasis/ scarring noted. There is no focal consolidation, pleural effusion, or pneumothorax. Mild cardiomegaly. Degenerative changes of spine.  IMPRESSION: No focal consolidation.  5 mm left mid lung field nodule with   Electronically Signed   By: Anner Crete M.D.   On: 10/31/2014 01:35   Dg Knee Complete 4 Views Right  10/31/2014   CLINICAL DATA:  81 year old male with right leg twisting and pain.  EXAM: RIGHT KNEE - COMPLETE 4+ VIEW  COMPARISON:  None.  FINDINGS: There is no evidence of fracture, dislocation, or joint effusion. There is no evidence of arthropathy or other focal bone abnormality. Soft tissues are unremarkable.  IMPRESSION: No fracture or  dislocation.   Electronically Signed   By: Anner Crete M.D.   On: 10/31/2014 00:42   Dg C-arm 61-120 Min-no Report  10/31/2014   CLINICAL DATA: surgery   C-ARM 61-120 MINUTES  Fluoroscopy was utilized by the requesting physician.  No radiographic  interpretation.    Dg Hip Unilat With Pelvis 2-3 Views Right  10/31/2014   CLINICAL DATA:  81 year old male with right leg twisting and pain.  EXAM: DG HIP (WITH OR WITHOUT PELVIS) 2-3V RIGHT  COMPARISON:  None.  FINDINGS: There is a minimally displaced fracture of the right femoral neck. There were head of the right femur is in anatomic alignment with the acetabulum. No other fracture identified. There is degenerative changes of the lower lumbar spine. Multiple surgical clips noted in the pelvis.  IMPRESSION: Right femoral neck fracture.   Electronically Signed   By: Anner Crete M.D.   On: 10/31/2014 00:41   Dg Femur, Min 2 Views Right  10/31/2014   CLINICAL DATA:  81 year old male with right leg twisting and pain.  EXAM: RIGHT FEMUR 2 VIEWS  COMPARISON:  None.  FINDINGS: There is no fracture or dislocation of the visualized right femoral shaft. The right femoral neck fracture seen on the hip radiograph is not included in this study. The soft tissues are grossly unremarkable.  IMPRESSION: No fracture of the visualized femoral diaphysis.   Electronically Signed   By: Anner Crete M.D.   On: 10/31/2014 00:44    Microbiology: Recent Results (from the past 240 hour(s))  MRSA PCR Screening     Status: None   Collection Time: 10/31/14  6:52 AM  Result Value Ref Range Status   MRSA by PCR NEGATIVE NEGATIVE Final    Comment:        The GeneXpert MRSA Assay (FDA approved for NASAL specimens only), is one component of a comprehensive MRSA colonization surveillance program. It is not intended to diagnose MRSA infection nor to guide or monitor treatment for MRSA infections.   Urine culture     Status: None   Collection Time: 10/31/14 10:00  AM  Result Value Ref Range Status   Specimen Description URINE, RANDOM  Final   Special Requests NONE  Final   Culture   Final  MULTIPLE SPECIES PRESENT, SUGGEST RECOLLECTION IF CLINICALLY INDICATED   Report Status 11/01/2014 FINAL  Final     Labs: Basic Metabolic Panel:  Recent Labs Lab 10/31/14 0102 10/31/14 0712  NA 141 142  K 3.7 3.9  CL 110 110  CO2 25 28  GLUCOSE 146* 107*  BUN 22* 16  CREATININE 1.05 1.05  CALCIUM 8.9 8.9   Liver Function Tests:  Recent Labs Lab 10/31/14 0712  AST 22  ALT 16*  ALKPHOS 60  BILITOT 0.6  PROT 6.3*  ALBUMIN 3.1*   No results for input(s): LIPASE, AMYLASE in the last 168 hours. No results for input(s): AMMONIA in the last 168 hours. CBC:  Recent Labs Lab 10/31/14 0102 10/31/14 0712 11/01/14 0430 11/02/14 0445  WBC 9.6 7.0 8.1  --   NEUTROABS 7.4 4.9  --   --   HGB 12.8* 12.0* 11.2* 9.8*  HCT 38.3* 35.6* 33.6* 28.2*  MCV 102.7* 101.4* 101.8*  --   PLT 202 174 181  --    Cardiac Enzymes: No results for input(s): CKTOTAL, CKMB, CKMBINDEX, TROPONINI in the last 168 hours. BNP: BNP (last 3 results) No results for input(s): BNP in the last 8760 hours.  ProBNP (last 3 results) No results for input(s): PROBNP in the last 8760 hours.  CBG: No results for input(s): GLUCAP in the last 168 hours.     SignedFlorencia Reasons MD, PhD  [facility] 11/02/2014, 11:08 AM

## 2014-11-02 NOTE — Care Management Important Message (Signed)
Important Message  Patient Details  Name: Marcus Duran MRN: [id number] Date of Birth: Oct 17, 1927   Medicare Important Message Given:  Yes-second notification given    Delorse Lek 11/02/2014, 1:08 PM

## 2014-11-04 ENCOUNTER — Non-Acute Institutional Stay (SKILLED_NURSING_FACILITY): Payer: Medicare HMO | Admitting: Internal Medicine

## 2014-11-04 ENCOUNTER — Encounter (HOSPITAL_COMMUNITY): Payer: Self-pay | Admitting: Orthopedic Surgery

## 2014-11-04 DIAGNOSIS — D62 Acute posthemorrhagic anemia: Secondary | ICD-10-CM | POA: Diagnosis not present

## 2014-11-04 DIAGNOSIS — K5900 Constipation, unspecified: Secondary | ICD-10-CM | POA: Diagnosis not present

## 2014-11-04 DIAGNOSIS — S72141S Displaced intertrochanteric fracture of right femur, sequela: Secondary | ICD-10-CM

## 2014-11-04 NOTE — Progress Notes (Signed)
Patient ID: Marcus Duran, male   DOB: 10/31/1927, 81 y.o.   MRN: [id number]     [place]  PCP: No primary care provider on file.  Code Status: Full Code   No Known Allergies  Chief Complaint  Patient presents with  . New Admit To SNF    New Admission      HPI:  81 y.o. patient is here for short term rehabilitation post hospital admission from 10/30/14-11/02/14 post fall with right hip fracture. Orthopedic was consulted and he underwent ORIF to right femur on 10/31/14. He is seen in his room today. His pain is under control with current regimen. Denies any concerns. He is from [place] and visiting here for a conference.   Review of Systems:  Constitutional: Negative for fever, chills, diaphoresis.  HENT: Negative for headache, congestion, nasal discharge Eyes: Negative for eye pain, blurred vision, double vision and discharge.  Respiratory: Negative for cough, shortness of breath and wheezing.   Cardiovascular: Negative for chest pain, palpitations, leg swelling.  Gastrointestinal: Negative for heartburn, nausea, vomiting, abdominal pain. Had bowel movement this am Genitourinary: Negative for dysuria, flank pain.  Musculoskeletal: Negative for back pain, falls in facility Skin: Negative for itching, rash.  Neurological: Negative for dizziness, tingling, focal weakness Psychiatric/Behavioral: Negative for depression   Past Medical History  Diagnosis Date  . Prostate cancer    Past Surgical History  Procedure Laterality Date  . Prostatectomy    . Cholecystectomy    . Hernia repair    . Hip arthroplasty Right 10/31/2014    Procedure: Right  ARTHROPLASTY BIPOLAR HIP (HEMIARTHROPLASTY);  Surgeon: Frederik Pear, MD;  Location: [facility];  Service: Orthopedics;  Laterality: Right;   Social History:   reports that he has never smoked. He does not have any smokeless tobacco history on file. He reports that he does not drink alcohol or use illicit drugs.  Family History   Problem Relation Age of Onset  . Hypertension Neg Hx   . Diabetes Mellitus II Neg Hx     Medications:   Medication List       This list is accurate as of: 11/04/14  2:52 PM.  Always use your most recent med list.               aspirin EC 325 MG tablet  Take 1 tablet (325 mg total) by mouth 2 (two) times daily.     oxyCODONE-acetaminophen 5-325 MG per tablet  Commonly known as:  ROXICET  Take 1 tablet by mouth every 6 (six) hours as needed.     polycarbophil 625 MG tablet  Commonly known as:  FIBERCON  Take 625 mg by mouth daily.     tizanidine 2 MG capsule  Commonly known as:  ZANAFLEX  Take 1 capsule (2 mg total) by mouth 3 (three) times daily as needed for muscle spasms.     vitamin C 500 MG tablet  Commonly known as:  ASCORBIC ACID  Take 500 mg by mouth daily.         Physical Exam: Filed Vitals:   11/04/14 0909  BP: 103/57  Pulse: 81  Temp: 98 F (36.7 C)  TempSrc: Oral  Resp: 18  Height: 5\' 10"  (1.778 m)  Weight: 165 lb 6.4 oz (75.025 kg)  SpO2: 95%    General- elderly male, well built, in no acute distress Head- normocephalic, atraumatic Throat- moist mucus membrane Eyes- PERRLA, EOMI, no pallor, no icterus, no discharge, normal conjunctiva, normal  sclera Neck- no cervical lymphadenopathy Cardiovascular- normal s1,s2, no murmurs, palpable dorsalis pedis and radial pulses, trace right leg edema Respiratory- bilateral clear to auscultation, no wheeze, no rhonchi, no crackles, no use of accessory muscles Abdomen- bowel sounds present, soft, non tender Musculoskeletal- able to move all 4 extremities, right leg ROM limited at hip with pain Neurological- no focal deficit, alert and oriented to person, place and time Skin- warm and dry, right hip surgical incision with dressing in place, site clean and dry Psychiatry- normal mood and affect    Labs reviewed: Basic Metabolic Panel:  Recent Labs  10/31/14 0102 10/31/14 0712  NA 141 142  K 3.7  3.9  CL 110 110  CO2 25 28  GLUCOSE 146* 107*  BUN 22* 16  CREATININE 1.05 1.05  CALCIUM 8.9 8.9   Liver Function Tests:  Recent Labs  10/31/14 0712  AST 22  ALT 16*  ALKPHOS 60  BILITOT 0.6  PROT 6.3*  ALBUMIN 3.1*   No results for input(s): LIPASE, AMYLASE in the last 8760 hours. No results for input(s): AMMONIA in the last 8760 hours. CBC:  Recent Labs  10/31/14 0102 10/31/14 0712 11/01/14 0430 11/02/14 0445  WBC 9.6 7.0 8.1  --   NEUTROABS 7.4 4.9  --   --   HGB 12.8* 12.0* 11.2* 9.8*  HCT 38.3* 35.6* 33.6* 28.2*  MCV 102.7* 101.4* 101.8*  --   PLT 202 174 181  --     Radiological Exams: Dg Tibia/fibula Right  10/31/2014   CLINICAL DATA:  Twisting injury to right leg, with right leg pain and numbness. Limited range of motion. Initial encounter.  EXAM: RIGHT TIBIA AND FIBULA - 2 VIEW  COMPARISON:  None.  FINDINGS: There is no evidence of fracture or dislocation. The tibia and fibula appear grossly intact. The knee joint is grossly unremarkable in appearance. No knee joint effusion is characterized. The ankle mortise is incompletely assessed, but appears grossly unremarkable. No definite soft tissue abnormalities are characterized on radiograph.  IMPRESSION: No evidence of fracture or dislocation.   Electronically Signed   By: Garald Balding M.D.   On: 10/31/2014 00:44   Dg Pelvis Portable  11/01/2014   CLINICAL DATA:  Status post right total hip arthroplasty. Initial encounter.  EXAM: PORTABLE PELVIS 1-2 VIEWS  COMPARISON:  Right hip radiographs performed 10/31/2014  FINDINGS: There is no evidence of new fracture or dislocation. The patient's right hip arthroplasty is grossly unremarkable. There is no evidence of loosening. Overlying soft tissue air is noted. Scattered apparent soft tissue calcifications are noted overlying the mid right femur.  Postoperative change is noted about the lower pelvis. The sacroiliac joints are grossly unremarkable. The left hip joint is  within normal limits.  IMPRESSION: No evidence of new fracture or dislocation. Right hip arthroplasty is grossly unremarkable, without evidence of loosening.   Electronically Signed   By: Garald Balding M.D.   On: 11/01/2014 01:59   Dg Chest Port 1 View  10/31/2014   CLINICAL DATA:  81 year old male with fall.  Preop chest radiograph  EXAM: PORTABLE CHEST - 1 VIEW  COMPARISON:  None.  FINDINGS: There is diffuse bilateral interstitial and vascular prominence. A 5 mm nodular density is noted the left mid lung field. Bibasilar linear atelectasis/ scarring noted. There is no focal consolidation, pleural effusion, or pneumothorax. Mild cardiomegaly. Degenerative changes of spine.  IMPRESSION: No focal consolidation.  5 mm left mid lung field nodule with   Electronically Signed  By: Anner Crete M.D.   On: 10/31/2014 01:35   Dg Knee Complete 4 Views Right  10/31/2014   CLINICAL DATA:  81 year old male with right leg twisting and pain.  EXAM: RIGHT KNEE - COMPLETE 4+ VIEW  COMPARISON:  None.  FINDINGS: There is no evidence of fracture, dislocation, or joint effusion. There is no evidence of arthropathy or other focal bone abnormality. Soft tissues are unremarkable.  IMPRESSION: No fracture or dislocation.   Electronically Signed   By: Anner Crete M.D.   On: 10/31/2014 00:42   Dg C-arm 61-120 Min-no Report  10/31/2014   CLINICAL DATA: surgery   C-ARM 61-120 MINUTES  Fluoroscopy was utilized by the requesting physician.  No radiographic  interpretation.    Dg Hip Unilat With Pelvis 2-3 Views Right  10/31/2014   CLINICAL DATA:  81 year old male with right leg twisting and pain.  EXAM: DG HIP (WITH OR WITHOUT PELVIS) 2-3V RIGHT  COMPARISON:  None.  FINDINGS: There is a minimally displaced fracture of the right femoral neck. There were head of the right femur is in anatomic alignment with the acetabulum. No other fracture identified. There is degenerative changes of the lower lumbar spine. Multiple  surgical clips noted in the pelvis.  IMPRESSION: Right femoral neck fracture.   Electronically Signed   By: Anner Crete M.D.   On: 10/31/2014 00:41   Dg Femur, Min 2 Views Right  10/31/2014   CLINICAL DATA:  81 year old male with right leg twisting and pain.  EXAM: RIGHT FEMUR 2 VIEWS  COMPARISON:  None.  FINDINGS: There is no fracture or dislocation of the visualized right femoral shaft. The right femoral neck fracture seen on the hip radiograph is not included in this study. The soft tissues are grossly unremarkable.  IMPRESSION: No fracture of the visualized femoral diaphysis.   Electronically Signed   By: Anner Crete M.D.   On: 10/31/2014 00:44     Assessment/Plan  Right hip fracture S/p ORIF. Will have him work with physical therapy and occupational therapy team to help with gait training and muscle strengthening exercises.fall precautions. Skin care. Encourage to be out of bed. WBAT. Has follow up with orthopedics. Continue roxicet 5/325 1 tab q6h prn pain and zanaflex tid prn muscle spasm. Continue aspirin ec 325 mg bid for dvt prophylaxis. Monitor clinically  Constipation Regular bowel movement at present. Continue fibercon. Hydration encouraged. Add colace 100 mg daily given him being on narcotics  Blood loss anemia Post op, hb 9.8 at discharge, check h&h   Goals of care: short term rehabilitation   Labs/tests ordered: cbc 11/07/14  Family/ staff Communication: reviewed care plan with patient and nursing supervisor    Blanchie Serve, MD  [place] [phone] (Monday-Friday 8 am - 5 pm) [phone] (afterhours)

## 2014-11-11 ENCOUNTER — Emergency Department (HOSPITAL_COMMUNITY)
Admission: EM | Admit: 2014-11-11 | Discharge: 2014-11-12 | Disposition: A | Discharge status: Home / Self Care | Payer: Medicare HMO | Attending: Emergency Medicine | Admitting: Emergency Medicine

## 2014-11-11 ENCOUNTER — Encounter (HOSPITAL_COMMUNITY): Payer: Self-pay | Admitting: Emergency Medicine

## 2014-11-11 ENCOUNTER — Non-Acute Institutional Stay (SKILLED_NURSING_FACILITY): Payer: Medicare HMO | Admitting: Adult Health

## 2014-11-11 DIAGNOSIS — Z7982 Long term (current) use of aspirin: Secondary | ICD-10-CM | POA: Diagnosis not present

## 2014-11-11 DIAGNOSIS — L988 Other specified disorders of the skin and subcutaneous tissue: Secondary | ICD-10-CM | POA: Insufficient documentation

## 2014-11-11 DIAGNOSIS — D62 Acute posthemorrhagic anemia: Secondary | ICD-10-CM | POA: Diagnosis not present

## 2014-11-11 DIAGNOSIS — Z8546 Personal history of malignant neoplasm of prostate: Secondary | ICD-10-CM | POA: Diagnosis not present

## 2014-11-11 DIAGNOSIS — S72001S Fracture of unspecified part of neck of right femur, sequela: Secondary | ICD-10-CM

## 2014-11-11 DIAGNOSIS — R600 Localized edema: Secondary | ICD-10-CM

## 2014-11-11 DIAGNOSIS — K5900 Constipation, unspecified: Secondary | ICD-10-CM

## 2014-11-11 DIAGNOSIS — Z79899 Other long term (current) drug therapy: Secondary | ICD-10-CM | POA: Insufficient documentation

## 2014-11-11 DIAGNOSIS — R2241 Localized swelling, mass and lump, right lower limb: Secondary | ICD-10-CM | POA: Insufficient documentation

## 2014-11-11 DIAGNOSIS — M7989 Other specified soft tissue disorders: Secondary | ICD-10-CM

## 2014-11-11 NOTE — ED Provider Notes (Signed)
CSN: [id number]     Arrival date & time 11/11/14  2222 History   First MD Initiated Contact with Patient 11/11/14 2227     Chief Complaint  Patient presents with  . Leg Swelling   Marcus Duran is a 81 y.o. male with a history of prostate cancer and right total hip replacement who presents to the ED from [facility] complaining of right lower leg swelling since yesterday. The patient had a total right hip replacement by Dr. Mayer Camel on 10/31/2014 or 11 days ago. Patient reports he's been doing well and has had no leg swelling since. Patient reports starting yesterday he has had right leg swelling. He denies any right leg pain. Patient reports [facility] was due to do a DVT study but this was not done yet. Patient reports he is from [place] and is due to return tomorrow. The patient denies previous history of DVTs or PEs. He denies personal or close family history of blood clotting disorders such as factor V Leiden, protein C or S deficiency. The patient denies fevers, chills, chest pain, shortness of breath, cough, wheezing, or recent illness.   (Consider location/radiation/quality/duration/timing/severity/associated sxs/prior Treatment) HPI  Past Medical History  Diagnosis Date  . Prostate cancer    Past Surgical History  Procedure Laterality Date  . Prostatectomy    . Cholecystectomy    . Hernia repair    . Hip arthroplasty Right 10/31/2014    Procedure: Right  ARTHROPLASTY BIPOLAR HIP (HEMIARTHROPLASTY);  Surgeon: Frederik Pear, MD;  Location: [facility];  Service: Orthopedics;  Laterality: Right;   Family History  Problem Relation Age of Onset  . Hypertension Neg Hx   . Diabetes Mellitus II Neg Hx    History  Substance Use Topics  . Smoking status: Never Smoker   . Smokeless tobacco: Not on file  . Alcohol Use: No    Review of Systems  Constitutional: Negative for fever and chills.  HENT: Negative for sore throat.   Eyes: Negative for visual disturbance.  Respiratory: Negative for  cough, shortness of breath and wheezing.   Cardiovascular: Positive for leg swelling. Negative for chest pain and palpitations.  Gastrointestinal: Negative for nausea, vomiting and abdominal pain.  Genitourinary: Negative for dysuria.  Musculoskeletal: Negative for back pain and neck pain.  Skin: Negative for rash.  Neurological: Negative for weakness, numbness and headaches.      Allergies  Review of patient's allergies indicates no known allergies.  Home Medications   Prior to Admission medications   Medication Sig Start Date End Date Taking? Authorizing Provider  aspirin EC 325 MG tablet Take 1 tablet (325 mg total) by mouth 2 (two) times daily. 11/02/14  Yes Leighton Parody, PA-C  oxyCODONE-acetaminophen (ROXICET) 5-325 MG per tablet Take 1 tablet by mouth every 6 (six) hours as needed. 11/02/14  Yes Leighton Parody, PA-C  polycarbophil (FIBERCON) 625 MG tablet Take 625 mg by mouth daily.   Yes Historical Provider, MD  tizanidine (ZANAFLEX) 2 MG capsule Take 1 capsule (2 mg total) by mouth 3 (three) times daily as needed for muscle spasms. 11/02/14  Yes Leighton Parody, PA-C  vitamin C (ASCORBIC ACID) 500 MG tablet Take 500 mg by mouth daily.   Yes Historical Provider, MD   BP 110/56 mmHg  Pulse 69  Temp(Src) 98.3 F (36.8 C) (Oral)  Resp 18  Ht 5\' 9"  (1.753 m)  Wt 158 lb (71.668 kg)  BMI 23.32 kg/m2  SpO2 98% Physical Exam  Constitutional: He  is oriented to person, place, and time. He appears well-developed and well-nourished. No distress.  Nontoxic-appearing.  HENT:  Head: Normocephalic and atraumatic.  Mouth/Throat: Oropharynx is clear and moist.  Eyes: Conjunctivae are normal. Pupils are equal, round, and reactive to light. Right eye exhibits no discharge. Left eye exhibits no discharge.  Neck: Neck supple.  Cardiovascular: Normal rate, regular rhythm, normal heart sounds and intact distal pulses.  Exam reveals no gallop and no friction rub.   No murmur  heard. Patient's right dorsalis pedis and posterior tibialis pulses are auscultated by Doppler. Heart rate is 76.  Pulmonary/Chest: Effort normal and breath sounds normal. No respiratory distress. He has no wheezes. He has no rales.  Abdominal: Soft. There is no tenderness.  Musculoskeletal: He exhibits edema. He exhibits no tenderness.  Patient has right lower leg, ankle and pedal edema without calf tenderness.   Lymphadenopathy:    He has no cervical adenopathy.  Neurological: He is alert and oriented to person, place, and time. Coordination normal.  Sensation is intact to his bilateral lower extremities.   Skin: Skin is warm and dry. No rash noted. He is not diaphoretic. No erythema. No pallor.  There is a small recently burst blister to his right heel. No discharge or erythema.   Psychiatric: He has a normal mood and affect. His behavior is normal.  Nursing note and vitals reviewed.   ED Course  Procedures (including critical care time) Labs Review Labs Reviewed  CBC WITH DIFFERENTIAL/PLATELET - Abnormal; Notable for the following:    RBC 2.87 (*)    Hemoglobin 10.2 (*)    HCT 29.6 (*)    MCV 103.1 (*)    MCH 35.5 (*)    Platelets 450 (*)    All other components within normal limits  COMPREHENSIVE METABOLIC PANEL - Abnormal; Notable for the following:    Glucose, Bld 107 (*)    Calcium 8.8 (*)    Total Protein 6.1 (*)    Albumin 2.4 (*)    All other components within normal limits  PROTIME-INR - Abnormal; Notable for the following:    Prothrombin Time 15.5 (*)    All other components within normal limits    Imaging Review No results found.   EKG Interpretation None      Filed Vitals:   11/12/14 0031 11/12/14 0045 11/12/14 0100 11/12/14 0115  BP: 126/56 110/59 114/79 110/56  Pulse: 84 66 75 69  Temp:      TempSrc:      Resp:      Height:      Weight:      SpO2: 98% 98% 95% 98%     MDM   Meds given in ED:  Medications  enoxaparin (LOVENOX) injection 70  mg (not administered)    New Prescriptions   No medications on file    Final diagnoses:  Right leg swelling   This is a 81 y.o. male with a history of prostate cancer and right total hip replacement who presents to the ED from [facility] complaining of right lower leg swelling since yesterday. The patient had a total right hip replacement by Dr. Mayer Camel on 10/31/2014 or 11 days ago. Patient reports he's been doing well and has had no leg swelling since. Patient reports starting yesterday he has had right leg swelling. He denies any right leg pain. On examination the patient is afebrile nontoxic appearing. Patient does have right lower leg, ankle and pedal edema without erythema or signs of  infection. This clinically appears to be a DVT. At this time of night we cannot do a DVT study. Will check blood work and place him on lovenox until we can have a formal DVT study in the morning.  CBC shows stable hemoglobin that is improved from his last visit with a hemoglobin of 10.2. CMP is unremarkable. INR is 1.22. Lovenox ordered per pharmacy consult. Will discharge back to [facility] place and have him return for DVT study in the morning. I advised the patient to follow-up with their primary care provider this week. I advised the patient to return to the emergency department with new or worsening symptoms or new concerns. The patient and his son verbalized understanding and agreement with plan.    This patient was discussed with and evaluated by Dr. Dina Rich who agrees with assessment and plan.    Waynetta Pean, PA-C 11/12/14 7939  Merryl Hacker, MD 11/12/14 [id number]

## 2014-11-11 NOTE — ED Notes (Signed)
Per [facility] - pt comes from [facility] c/o R lower leg swelling and redness.  Pt has been at [facility] for 2 weeks for rehab following R hip total replacement.  Per PTAR, pt had swelling r/t the surgery up until yesterday, when the swelling became worse.  Pt's R foot and lower leg are swollen, red, and warm to touch.  [facility] staff ordered doppler to be done, but wasn't done in time - staff wants PT to be checked out.  Blister to R lateral heel seems to have popped.

## 2014-11-12 ENCOUNTER — Ambulatory Visit (HOSPITAL_BASED_OUTPATIENT_CLINIC_OR_DEPARTMENT_OTHER)
Admission: RE | Admit: 2014-11-12 | Discharge: 2014-11-12 | Disposition: A | Discharge status: Home / Self Care | Payer: Medicare HMO | Source: Ambulatory Visit | Attending: Emergency Medicine | Admitting: Emergency Medicine

## 2014-11-12 ENCOUNTER — Other Ambulatory Visit (HOSPITAL_COMMUNITY): Payer: Self-pay

## 2014-11-12 DIAGNOSIS — R29898 Other symptoms and signs involving the musculoskeletal system: Secondary | ICD-10-CM

## 2014-11-12 DIAGNOSIS — M62838 Other muscle spasm: Secondary | ICD-10-CM | POA: Insufficient documentation

## 2014-11-12 LAB — COMPREHENSIVE METABOLIC PANEL WITH GFR
ALT: 18 U/L (ref 17–63)
AST: 20 U/L (ref 15–41)
Albumin: 2.4 g/dL — ABNORMAL LOW (ref 3.5–5.0)
Alkaline Phosphatase: 74 U/L (ref 38–126)
Anion gap: 7 (ref 5–15)
BUN: 11 mg/dL (ref 6–20)
CO2: 26 mmol/L (ref 22–32)
Calcium: 8.8 mg/dL — ABNORMAL LOW (ref 8.9–10.3)
Chloride: 103 mmol/L (ref 101–111)
Creatinine, Ser: 0.99 mg/dL (ref 0.61–1.24)
GFR calc Af Amer: >60 mL/min
GFR calc non Af Amer: >60 mL/min
Glucose, Bld: 107 mg/dL — ABNORMAL HIGH (ref 65–99)
Potassium: 4.3 mmol/L (ref 3.5–5.1)
Sodium: 136 mmol/L (ref 135–145)
Total Bilirubin: 0.8 mg/dL (ref 0.3–1.2)
Total Protein: 6.1 g/dL — ABNORMAL LOW (ref 6.5–8.1)

## 2014-11-12 LAB — CBC WITH DIFFERENTIAL/PLATELET
Basophils Absolute: 0 10*3/uL (ref 0.0–0.1)
Basophils Relative: 0 % (ref 0–1)
Eosinophils Absolute: 0.2 10*3/uL (ref 0.0–0.7)
Eosinophils Relative: 2 % (ref 0–5)
HCT: 29.6 % — ABNORMAL LOW (ref 39.0–52.0)
Hemoglobin: 10.2 g/dL — ABNORMAL LOW (ref 13.0–17.0)
Lymphocytes Relative: 25 % (ref 12–46)
Lymphs Abs: 1.7 10*3/uL (ref 0.7–4.0)
MCH: 35.5 pg — ABNORMAL HIGH (ref 26.0–34.0)
MCHC: 34.5 g/dL (ref 30.0–36.0)
MCV: 103.1 fL — ABNORMAL HIGH (ref 78.0–100.0)
Monocytes Absolute: 0.8 10*3/uL (ref 0.1–1.0)
Monocytes Relative: 11 % (ref 3–12)
Neutro Abs: 4.2 10*3/uL (ref 1.7–7.7)
Neutrophils Relative %: 62 % (ref 43–77)
Platelets: 450 10*3/uL — ABNORMAL HIGH (ref 150–400)
RBC: 2.87 MIL/uL — ABNORMAL LOW (ref 4.22–5.81)
RDW: 13.7 % (ref 11.5–15.5)
WBC: 6.9 10*3/uL (ref 4.0–10.5)

## 2014-11-12 LAB — PROTIME-INR
INR: 1.22 (ref 0.00–1.49)
Prothrombin Time: 15.5 s — ABNORMAL HIGH (ref 11.6–15.2)

## 2014-11-12 MED ORDER — ENOXAPARIN SODIUM 80 MG/0.8ML ~~LOC~~ SOLN
70.0000 mg | Freq: Once | SUBCUTANEOUS | Status: AC
  Administered 2014-11-12: 70 mg via SUBCUTANEOUS
  Filled 2014-11-12: qty 0.8

## 2014-11-12 NOTE — Progress Notes (Signed)
VASCULAR LAB PRELIMINARY  PRELIMINARY  PRELIMINARY  PRELIMINARY  Right lower extremity venous Doppler completed.    Preliminary report:  There is no DVT or SVT noted in the right lower extremity.   Octavia Velador, RVT 11/12/2014, 8:34 AM

## 2014-11-12 NOTE — Discharge Instructions (Signed)
Deep Vein Thrombosis °A deep vein thrombosis (DVT) is a blood clot that develops in the deep, larger veins of the leg, arm, or pelvis. These are more dangerous than clots that might form in veins near the surface of the body. A DVT can lead to serious and even life-threatening complications if the clot breaks off and travels in the bloodstream to the lungs.  °A DVT can damage the valves in your leg veins so that instead of flowing upward, the blood pools in the lower leg. This is called post-thrombotic syndrome, and it can result in pain, swelling, discoloration, and sores on the leg. °CAUSES °Usually, several things contribute to the formation of blood clots. Contributing factors include: °· The flow of blood slows down. °· The inside of the vein is damaged in some way. °· You have a condition that makes blood clot more easily. °RISK FACTORS °Some people are more likely than others to develop blood clots. Risk factors include:  °· Smoking. °· Being overweight (obese). °· Sitting or lying still for a long time. This includes long-distance travel, paralysis, or recovery from an illness or surgery. °Other factors that increase risk are:  °· Older age, especially over 75 years of age. °· Having a family history of blood clots or if you have already had a blot clot. °· Having major or lengthy surgery. This is especially true for surgery on the hip, knee, or belly (abdomen). Hip surgery is particularly high risk. °· Having a long, thin tube (catheter) placed inside a vein during a medical procedure. °· Breaking a hip or leg. °· Having cancer or cancer treatment. °· Pregnancy and childbirth. °¨ Hormone changes make the blood clot more easily during pregnancy. °¨ The fetus puts pressure on the veins of the pelvis. °¨ There is a risk of injury to veins during delivery or a caesarean delivery. The risk is highest just after childbirth. °· Medicines containing the male hormone estrogen. This includes birth control pills and  hormone replacement therapy. °· Other circulation or heart problems. ° °SIGNS AND SYMPTOMS °When a clot forms, it can either partially or totally block the blood flow in that vein. Symptoms of a DVT can include: °· Swelling of the leg or arm, especially if one side is much worse. °· Warmth and redness of the leg or arm, especially if one side is much worse. °· Pain in an arm or leg. If the clot is in the leg, symptoms may be more noticeable or worse when standing or walking. °The symptoms of a DVT that has traveled to the lungs (pulmonary embolism, PE) usually start suddenly and include: °· Shortness of breath. °· Coughing. °· Coughing up blood or blood-tinged mucus. °· Chest pain. The chest pain is often worse with deep breaths. °· Rapid heartbeat. °Anyone with these symptoms should get emergency medical treatment right away. Do not wait to see if the symptoms will go away. Call your local emergency services (911 in the [place] if you have these symptoms. Do not drive yourself to the hospital. °DIAGNOSIS °If a DVT is suspected, your health care provider will take a full medical history and perform a physical exam. Tests that also may be required include: °· Blood tests, including studies of the clotting properties of the blood. °· Ultrasound to see if you have clots in your legs or lungs. °· X-rays to show the flow of blood when dye is injected into the veins (venogram). °· Studies of your lungs if you have any   chest symptoms. °PREVENTION °· Exercise the legs regularly. Take a brisk 30-minute walk every day. °· Maintain a weight that is appropriate for your height. °· Avoid sitting or lying in bed for long periods of time without moving your legs. °· Women, particularly those over the age of 35 years, should consider the risks and benefits of taking estrogen medicines, including birth control pills. °· Do not smoke, especially if you take estrogen medicines. °· Long-distance travel can increase your risk of DVT. You  should exercise your legs by walking or pumping the muscles every hour. °· Many of the risk factors above relate to situations that exist with hospitalization, either for illness, injury, or elective surgery. Prevention may include medical and nonmedical measures. °¨ Your health care provider will assess you for the need for venous thromboembolism prevention when you are admitted to the hospital. If you are having surgery, your surgeon will assess you the day of or day after surgery. °TREATMENT °Once identified, a DVT can be treated. It can also be prevented in some circumstances. Once you have had a DVT, you may be at increased risk for a DVT in the future. The most common treatment for DVT is blood-thinning (anticoagulant) medicine, which reduces the blood's tendency to clot. Anticoagulants can stop new blood clots from forming and stop old clots from growing. They cannot dissolve existing clots. Your body does this by itself over time. Anticoagulants can be given by mouth, through an IV tube, or by injection. Your health care provider will determine the best program for you. Other medicines or treatments that may be used are: °· Heparin or related medicines (low molecular weight heparin) are often the first treatment for a blood clot. They act quickly. However, they cannot be taken orally and must be given either in shot form or by IV tube. °¨ Heparin can cause a fall in a component of blood that stops bleeding and forms blood clots (platelets). You will be monitored with blood tests to be sure this does not occur. °· Warfarin is an anticoagulant that can be swallowed. It takes a few days to start working, so usually heparin or related medicines are used in combination. Once warfarin is working, heparin is usually stopped. °· Factor Xa inhibitor medicines, such as rivaroxaban and apixaban, also reduce blood clotting. These medicines are taken orally and can often be used without heparin or related  medicines. °· Less commonly, clot dissolving drugs (thrombolytics) are used to dissolve a DVT. They carry a high risk of bleeding, so they are used mainly in severe cases where your life or a part of your body is threatened. °· Very rarely, a blood clot in the leg needs to be removed surgically. °· If you are unable to take anticoagulants, your health care provider may arrange for you to have a filter placed in a main vein in your abdomen. This filter prevents clots from traveling to your lungs. °HOME CARE INSTRUCTIONS °· Take all medicines as directed by your health care provider. °· Learn as much as you can about DVT. °· Wear a medical alert bracelet or carry a medical alert card. °· Ask your health care provider how soon you can go back to normal activities. It is important to stay active to prevent blood clots. If you are on anticoagulant medicine, avoid contact sports. °· It is very important to exercise. This is especially important while traveling, sitting, or standing for long periods of time. Exercise your legs by walking or by   tightening and relaxing your leg muscles regularly. Take frequent walks. °· You may need to wear compression stockings. These are tight elastic stockings that apply pressure to the lower legs. This pressure can help keep the blood in the legs from clotting. °Taking Warfarin °Warfarin is a daily medicine that is taken by mouth. Your health care provider will advise you on the length of treatment (usually 3-6 months, sometimes lifelong). If you take warfarin: °· Understand how to take warfarin and foods that can affect how warfarin works in your body. °· Too much and too little warfarin are both dangerous. Too much warfarin increases the risk of bleeding. Too little warfarin continues to allow the risk for blood clots. °Warfarin and Regular Blood Testing °While taking warfarin, you will need to have regular blood tests to measure your blood clotting time. These blood tests usually  include both the prothrombin time (PT) and international normalized ratio (INR) tests. The PT and INR results allow your health care provider to adjust your dose of warfarin. It is very important that you have your PT and INR tested as often as directed by your health care provider.    °Warfarin and Your Diet °Avoid major changes in your diet, or notify your health care provider before changing your diet. Arrange a visit with a registered dietitian to answer your questions. Many foods, especially foods high in vitamin K, can interfere with warfarin and affect the PT and INR results. You should eat a consistent amount of foods high in vitamin K. Foods high in vitamin K include:  °· Spinach, kale, broccoli, cabbage, collard and turnip greens, Brussels sprouts, peas, cauliflower, seaweed, and parsley. °· Beef and pork liver. °· Green tea. °· Soybean oil. °Warfarin with Other Medicines °Many medicines can interfere with warfarin and affect the PT and INR results. You must: °· Tell your health care provider about any and all medicines, vitamins, and supplements you take, including aspirin and other over-the-counter anti-inflammatory medicines. Be especially cautious with aspirin and anti-inflammatory medicines. Ask your health care provider before taking these. °· Do not take or discontinue any prescribed or over-the-counter medicine except on the advice of your health care provider or pharmacist. °Warfarin Side Effects °Warfarin can have side effects, such as easy bruising and difficulty stopping bleeding. Ask your health care provider or pharmacist about other side effects of warfarin. You will need to: °· Hold pressure over cuts for longer than usual. °· Notify your dentist and other health care providers that you are taking warfarin before you undergo any procedures where bleeding may occur. °Warfarin with Alcohol and Tobacco  °· Drinking alcohol frequently can increase the effect of warfarin, leading to excess  bleeding. It is best to avoid alcoholic drinks or to consume only very small amounts while taking warfarin. Notify your health care provider if you change your alcohol intake.   °· Do not use any tobacco products including cigarettes, chewing tobacco, or electronic cigarettes. If you smoke, quit. Ask your health care provider for help with quitting smoking. °Alternative Medicines to Warfarin: Factor Xa Inhibitor Medicines °· These blood-thinning medicines are taken by mouth, usually for several weeks or longer. It is important to take the medicine every single day at the same time each day. °· There are no regular blood tests required when using these medicines. °· There are fewer food and drug interactions than with warfarin. °· The side effects of this class of medicine are similar to those of warfarin, including excessive bruising or bleeding. Ask your   health care provider or pharmacist about other potential side effects. SEEK MEDICAL CARE IF:  You notice a rapid heartbeat.  You feel weaker or more tired than usual.  You feel faint.  You notice increased bruising.  You feel your symptoms are not getting better in the time expected.  You believe you are having side effects of medicine. SEEK IMMEDIATE MEDICAL CARE IF:  You have chest pain.  You have trouble breathing.  You have new or increased swelling or pain in one leg.  You cough up blood.  You notice blood in vomit, in a bowel movement, or in urine. MAKE SURE YOU:  Understand these instructions.  Will watch your condition.  Will get help right away if you are not doing well or get worse. Document Released: 04/01/2005 Document Revised: 08/16/2013 Document Reviewed: 12/07/2012 [facility] Patient Information 2015 [place], [place]. This information is not intended to replace advice given to you by your health care provider. Make sure you discuss any questions you have with your health care provider. Enoxaparin injection What is this  medicine? ENOXAPARIN (ee nox a PA rin) is used after knee, hip, or abdominal surgeries to prevent blood clotting. It is also used to treat existing blood clots in the lungs or in the veins. This medicine may be used for other purposes; ask your health care provider or pharmacist if you have questions. COMMON BRAND NAME(S): Lovenox What should I tell my health care provider before I take this medicine? They need to know if you have any of these conditions: -bleeding disorders, hemorrhage, or hemophilia -infection of the heart or heart valves -kidney or liver disease -previous stroke -prosthetic heart valve -recent surgery or delivery of a baby -ulcer in the stomach or intestine, diverticulitis, or other bowel disease -an unusual or allergic reaction to enoxaparin, heparin, pork or pork products, other medicines, foods, dyes, or preservatives -pregnant or trying to get pregnant -breast-feeding How should I use this medicine? This medicine is for injection under the skin. It is usually given by a health-care professional. You or a family member may be trained on how to give the injections. If you are to give yourself injections, make sure you understand how to use the syringe, measure the dose if necessary, and give the injection. To avoid bruising, do not rub the site where this medicine has been injected. Do not take your medicine more often than directed. Do not stop taking except on the advice of your doctor or health care professional. Make sure you receive a puncture-resistant container to dispose of the needles and syringes once you have finished with them. Do not reuse these items. Return the container to your doctor or health care professional for proper disposal. Talk to your pediatrician regarding the use of this medicine in children. Special care may be needed. Overdosage: If you think you have taken too much of this medicine contact a poison control center or emergency room at  once. NOTE: This medicine is only for you. Do not share this medicine with others. What if I miss a dose? If you miss a dose, take it as soon as you can. If it is almost time for your next dose, take only that dose. Do not take double or extra doses. What may interact with this medicine? -aspirin and aspirin-like medicines -certain medicines that treat or prevent blood clots -dipyridamole -NSAIDs, medicines for pain and inflammation, like ibuprofen or naproxen This list may not describe all possible interactions. Give your health care provider  a list of all the medicines, herbs, non-prescription drugs, or dietary supplements you use. Also tell them if you smoke, drink alcohol, or use illegal drugs. Some items may interact with your medicine. What should I watch for while using this medicine? Visit your doctor or health care professional for regular checks on your progress. Your condition will be monitored carefully while you are receiving this medicine. Notify your doctor or health care professional and seek emergency treatment if you develop breathing problems; changes in vision; chest pain; severe, sudden headache; pain, swelling, warmth in the leg; trouble speaking; sudden numbness or weakness of the face, arm, or leg. These can be signs that your condition has gotten worse. If you are going to have surgery, tell your doctor or health care professional that you are taking this medicine. Do not stop taking this medicine without first talking to your doctor. Be sure to refill your prescription before you run out of medicine. Avoid sports and activities that might cause injury while you are using this medicine. Severe falls or injuries can cause unseen bleeding. Be careful when using sharp tools or knives. Consider using an Copy. Take special care brushing or flossing your teeth. Report any injuries, bruising, or red spots on the skin to your doctor or health care professional. What side  effects may I notice from receiving this medicine? Side effects that you should report to your doctor or health care professional as soon as possible: -allergic reactions like skin rash, itching or hives, swelling of the face, lips, or tongue -feeling faint or lightheaded, falls -signs and symptoms of bleeding such as bloody or black, tarry stools; red or dark-brown urine; spitting up blood or brown material that looks like coffee grounds; red spots on the skin; unusual bruising or bleeding from the eye, gums, or nose Side effects that usually do not require medical attention (report to your doctor or health care professional if they continue or are bothersome): -pain, redness, or irritation at site where injected This list may not describe all possible side effects. Call your doctor for medical advice about side effects. You may report side effects to FDA at 1-800-FDA-1088. Where should I keep my medicine? Keep out of the reach of children. Store at room temperature between 15 and 30 degrees C (59 and 86 degrees F). Do not freeze. If your injections have been specially prepared, you may need to store them in the refrigerator. Ask your pharmacist. Throw away any unused medicine after the expiration date. NOTE: This sheet is a summary. It may not cover all possible information. If you have questions about this medicine, talk to your doctor, pharmacist, or health care provider.  2015, [employer]. (2013-08-03 16:06:21)

## 2014-11-13 ENCOUNTER — Encounter: Payer: Self-pay | Admitting: Adult Health

## 2014-11-13 NOTE — Progress Notes (Signed)
Patient ID: Marcus Duran, male   DOB: 04-04-1928, 81 y.o.   MRN: [id number]    DATE:  11/11/14 MRN:  [id number]  BIRTHDAY: 1927/10/25  Facility:  [facility]ursing Home Location:  [place] [facility] [place] Room Number: 010-U  LEVEL OF CARE:  [phone])  Contact Information    Name Relation Home Work Mobile   Dds Marcus Duran Son [phone]  [phone]   Marcus Duran [phone]  [phone]   Marcus Duran   [phone]   Marcus Duran   [phone]       Chief Complaint  Patient presents with  . Discharge Note    Right hip fracture S/P ORIF, Anemia, Constipation and edema RLE    HISTORY OF PRESENT ILLNESS:  This is an 81 year old male who had a fall and sustained a right hip fracture. He had ORIF of right femur on 10/31/14. Noted to have RLE edema 3+ and blister on right heel. I had a concern for DVT venous doppler ultrasound was ordered. He is for discharge to an SNF in [place]. He has requested that his Percocet be discontinued since he does not have any pain.  PAST MEDICAL HISTORY:  Past Medical History  Diagnosis Date  . Prostate cancer      CURRENT MEDICATIONS: Reviewed  Patient's Medications  New Prescriptions   No medications on file  Previous Medications   ASPIRIN EC 325 MG TABLET    Take 1 tablet (325 mg total) by mouth 2 (two) times daily.   OXYCODONE-ACETAMINOPHEN (ROXICET) 5-325 MG PER TABLET    Take 1 tablet by mouth every 6 (six) hours as needed.   POLYCARBOPHIL (FIBERCON) 625 MG TABLET    Take 625 mg by mouth daily.   TIZANIDINE (ZANAFLEX) 2 MG CAPSULE    Take 1 capsule (2 mg total) by mouth 3 (three) times daily as needed for muscle spasms.   VITAMIN C (ASCORBIC ACID) 500 MG TABLET    Take 500 mg by mouth daily.  Modified Medications   No medications on file  Discontinued Medications   No medications on file     No Known Allergies   REVIEW OF SYSTEMS:  GENERAL: no change in appetite, no fatigue, no weight  changes, no fever, chills or weakness EYES: Denies change in vision, dry eyes, eye pain, itching or discharge EARS: Denies change in hearing, ringing in ears, or earache NOSE: Denies nasal congestion or epistaxis MOUTH and THROAT: Denies oral discomfort, gingival pain or bleeding, pain from teeth or hoarseness   RESPIRATORY: no cough, SOB, DOE, wheezing, hemoptysis CARDIAC: no chest pain, edema or palpitations GI: no abdominal pain, diarrhea, constipation, heart burn, nausea or vomiting GU: Denies dysuria, frequency, hematuria, incontinence, or discharge PSYCHIATRIC: Denies feeling of depression or anxiety. No report of hallucinations, insomnia, paranoia, or agitation   PHYSICAL EXAMINATION  GENERAL APPEARANCE: Well nourished. In no acute distress. Normal body habitus SKIN:  Right hip surgical incision is covered with aquacel dressing, dry, no redness; right heel has blister, right inner groin has open blisters HEAD: Normal in size and contour. No evidence of trauma EYES: Lids open and close normally. No blepharitis, entropion or ectropion. PERRL. Conjunctivae are clear and sclerae are white. Lenses are without opacity EARS: Pinnae are normal. Patient hears normal voice tunes of the examiner MOUTH and THROAT: Lips are without lesions. Oral mucosa is moist and without lesions. Tongue is normal in shape, size, and color and without lesions NECK: supple, trachea midline, no neck masses, no  thyroid tenderness, no thyromegaly LYMPHATICS: no LAN in the neck, no supraclavicular LAN RESPIRATORY: breathing is even & unlabored, BS CTAB CARDIAC: RRR, no murmur,no extra heart sounds, no edema GI: abdomen soft, normal BS, no masses, no tenderness, no hepatomegaly, no splenomegaly EXTREMITIES:  Able to move X 4 extremities PSYCHIATRIC: Alert and oriented X 3. Affect and behavior are appropriate  LABS/RADIOLOGY: Labs reviewed: 11/07/14  WBC 7.8 hemoglobin 9.4 hematocrit 26.6 MCV 100 platelet 916    Basic Metabolic Panel:  Recent Labs  10/31/14 0102 10/31/14 0712 11/12/14 0005  NA 141 142 136  K 3.7 3.9 4.3  CL 110 110 103  CO2 25 28 26   GLUCOSE 146* 107* 107*  BUN 22* 16 11  CREATININE 1.05 1.05 0.99  CALCIUM 8.9 8.9 8.8*   Liver Function Tests:  Recent Labs  10/31/14 0712 11/12/14 0005  AST 22 20  ALT 16* 18  ALKPHOS 60 74  BILITOT 0.6 0.8  PROT 6.3* 6.1*  ALBUMIN 3.1* 2.4*   CBC:  Recent Labs  10/31/14 0102 10/31/14 0712 11/01/14 0430 11/02/14 0445 11/12/14 0005  WBC 9.6 7.0 8.1  --  6.9  NEUTROABS 7.4 4.9  --   --  4.2  HGB 12.8* 12.0* 11.2* 9.8* 10.2*  HCT 38.3* 35.6* 33.6* 28.2* 29.6*  MCV 102.7* 101.4* 101.8*  --  103.1*  PLT 202 174 181  --  450*   Dg Tibia/fibula Right  10/31/2014   CLINICAL DATA:  Twisting injury to right leg, with right leg pain and numbness. Limited range of motion. Initial encounter.  EXAM: RIGHT TIBIA AND FIBULA - 2 VIEW  COMPARISON:  None.  FINDINGS: There is no evidence of fracture or dislocation. The tibia and fibula appear grossly intact. The knee joint is grossly unremarkable in appearance. No knee joint effusion is characterized. The ankle mortise is incompletely assessed, but appears grossly unremarkable. No definite soft tissue abnormalities are characterized on radiograph.  IMPRESSION: No evidence of fracture or dislocation.   Electronically Signed   By: Garald Balding M.D.   On: 10/31/2014 00:44   Dg Pelvis Portable  11/01/2014   CLINICAL DATA:  Status post right total hip arthroplasty. Initial encounter.  EXAM: PORTABLE PELVIS 1-2 VIEWS  COMPARISON:  Right hip radiographs performed 10/31/2014  FINDINGS: There is no evidence of new fracture or dislocation. The patient's right hip arthroplasty is grossly unremarkable. There is no evidence of loosening. Overlying soft tissue air is noted. Scattered apparent soft tissue calcifications are noted overlying the mid right femur.  Postoperative change is noted about the lower  pelvis. The sacroiliac joints are grossly unremarkable. The left hip joint is within normal limits.  IMPRESSION: No evidence of new fracture or dislocation. Right hip arthroplasty is grossly unremarkable, without evidence of loosening.   Electronically Signed   By: Garald Balding M.D.   On: 11/01/2014 01:59   Dg Chest Port 1 View  10/31/2014   CLINICAL DATA:  81 year old male with fall.  Preop chest radiograph  EXAM: PORTABLE CHEST - 1 VIEW  COMPARISON:  None.  FINDINGS: There is diffuse bilateral interstitial and vascular prominence. A 5 mm nodular density is noted the left mid lung field. Bibasilar linear atelectasis/ scarring noted. There is no focal consolidation, pleural effusion, or pneumothorax. Mild cardiomegaly. Degenerative changes of spine.  IMPRESSION: No focal consolidation.  5 mm left mid lung field nodule with   Electronically Signed   By: Anner Crete M.D.   On: 10/31/2014 01:35   Dg  Knee Complete 4 Views Right  10/31/2014   CLINICAL DATA:  81 year old male with right leg twisting and pain.  EXAM: RIGHT KNEE - COMPLETE 4+ VIEW  COMPARISON:  None.  FINDINGS: There is no evidence of fracture, dislocation, or joint effusion. There is no evidence of arthropathy or other focal bone abnormality. Soft tissues are unremarkable.  IMPRESSION: No fracture or dislocation.   Electronically Signed   By: Anner Crete M.D.   On: 10/31/2014 00:42   Dg C-arm 61-120 Min-no Report  10/31/2014   CLINICAL DATA: surgery   C-ARM 61-120 MINUTES  Fluoroscopy was utilized by the requesting physician.  No radiographic  interpretation.    Dg Hip Unilat With Pelvis 2-3 Views Right  10/31/2014   CLINICAL DATA:  81 year old male with right leg twisting and pain.  EXAM: DG HIP (WITH OR WITHOUT PELVIS) 2-3V RIGHT  COMPARISON:  None.  FINDINGS: There is a minimally displaced fracture of the right femoral neck. There were head of the right femur is in anatomic alignment with the acetabulum. No other fracture  identified. There is degenerative changes of the lower lumbar spine. Multiple surgical clips noted in the pelvis.  IMPRESSION: Right femoral neck fracture.   Electronically Signed   By: Anner Crete M.D.   On: 10/31/2014 00:41   Dg Femur, Min 2 Views Right  10/31/2014   CLINICAL DATA:  81 year old male with right leg twisting and pain.  EXAM: RIGHT FEMUR 2 VIEWS  COMPARISON:  None.  FINDINGS: There is no fracture or dislocation of the visualized right femoral shaft. The right femoral neck fracture seen on the hip radiograph is not included in this study. The soft tissues are grossly unremarkable.  IMPRESSION: No fracture of the visualized femoral diaphysis.   Electronically Signed   By: Anner Crete M.D.   On: 10/31/2014 00:44    ASSESSMENT/PLAN:  Right hip fracture S/P ORIF - for discharge to another SNF in [place]; Percocet was recently discontinued per patient's request; continue aspirin 325 mg 1 tab by mouth twice a day 1 week for DVT prophylaxis; and Zanaflex 2 mg 1 capsule by mouth 3 times a day when necessary for muscle spasm; RLE WBAT  Anemia, acute blood loss - hemoglobin 9.4; stable  Edema of RLE - venous Doppler ultrasound on RLE to R/O DVT  Constipation - continue Colace 100 mg 1 capsule by mouth daily and FiberCon 625 mg 1 tab by mouth daily    I have filled out patient's discharge paperwork and written prescriptions.   Total discharge time: Less than 30 minutes  Discharge time involved coordination of the discharge process with Education officer, museum, nursing staff and therapy department.     [facility] [place], NP [employer] [phone]

## 2016-08-20 IMAGING — CR DG HIP (WITH OR WITHOUT PELVIS) 2-3V*R*
3 series · 3 of 3 positions shown · non-contrast
Comparison: None.

CLINICAL DATA: 87-year-old male with right leg twisting and pain.

EXAM:
DG HIP (WITH OR WITHOUT PELVIS) 2-3V RIGHT

[t pelvis ap]
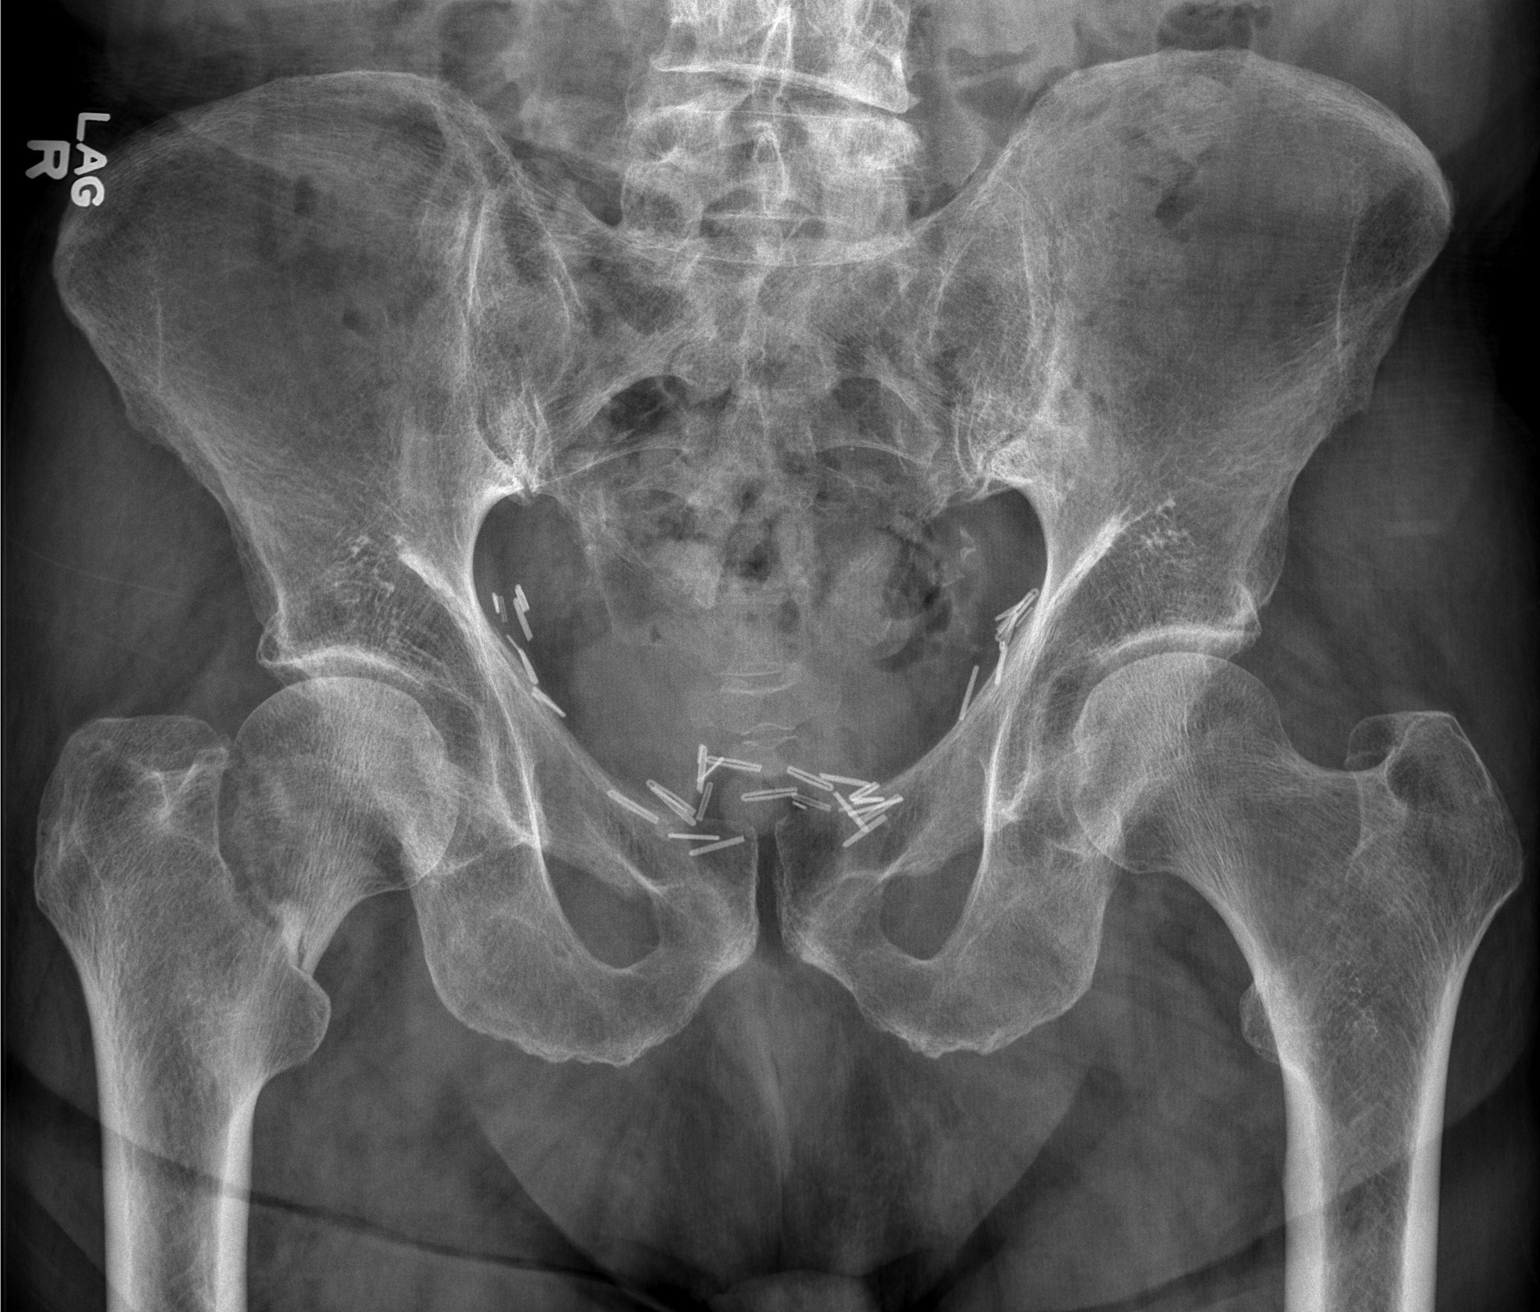

[t hip ap right]
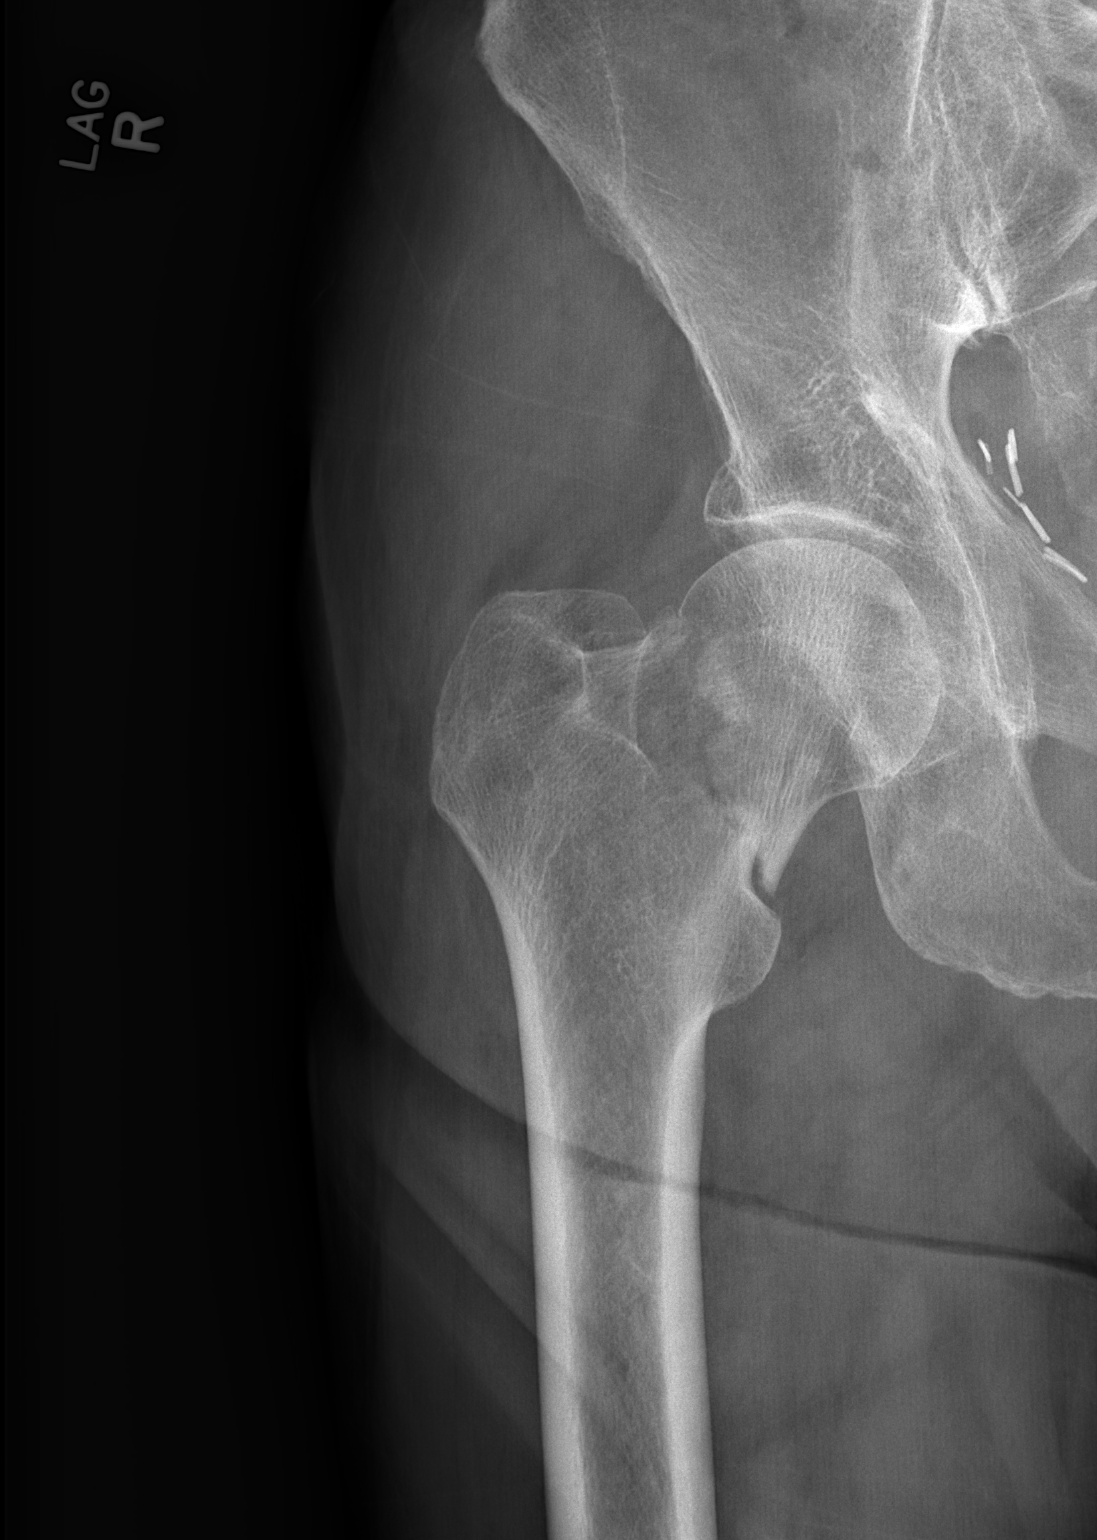

[w hip lat right]
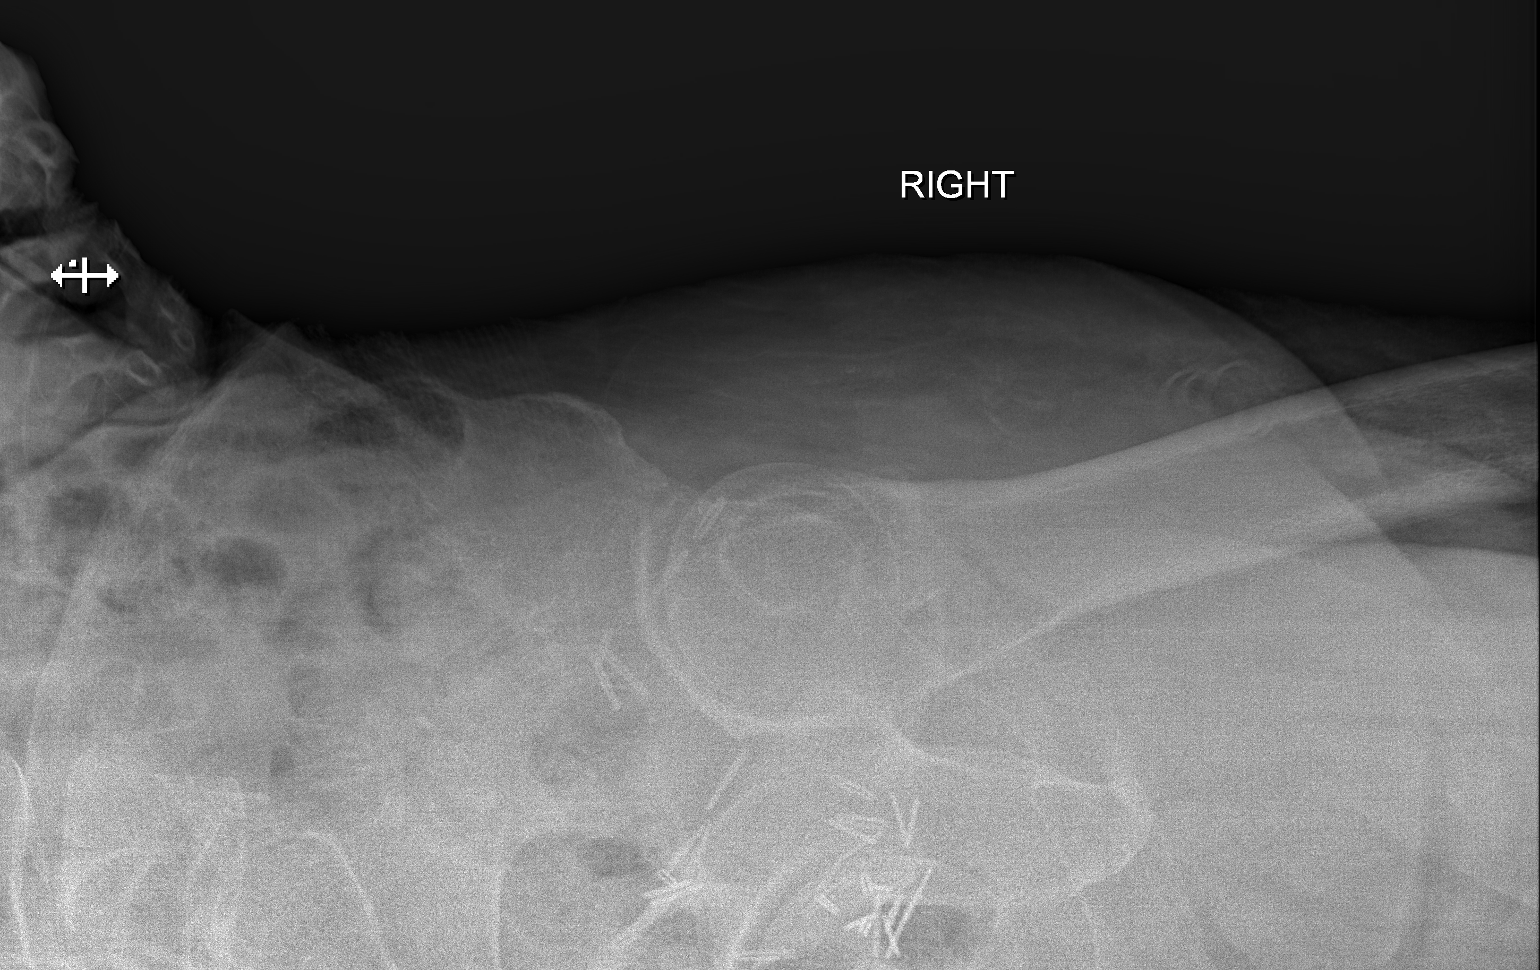

[3 of 3 positions shown; findings below may reference images not displayed]

FINDINGS: There is a minimally displaced fracture of the right femoral neck.
There were head of the right femur is in anatomic alignment with the
acetabulum. No other fracture identified. There is degenerative
changes of the lower lumbar spine. Multiple surgical clips noted in
the pelvis.
IMPRESSION: Right femoral neck fracture.

## 2016-08-20 IMAGING — CR DG TIBIA/FIBULA 2V*R*
4 series · 4 of 4 positions shown · non-contrast
Comparison: None.

CLINICAL DATA: Twisting injury to right leg, with right leg pain
and numbness. Limited range of motion. Initial encounter.

EXAM:
RIGHT TIBIA AND FIBULA - 2 VIEW

[t tib-fib ap right (1 of 2)]
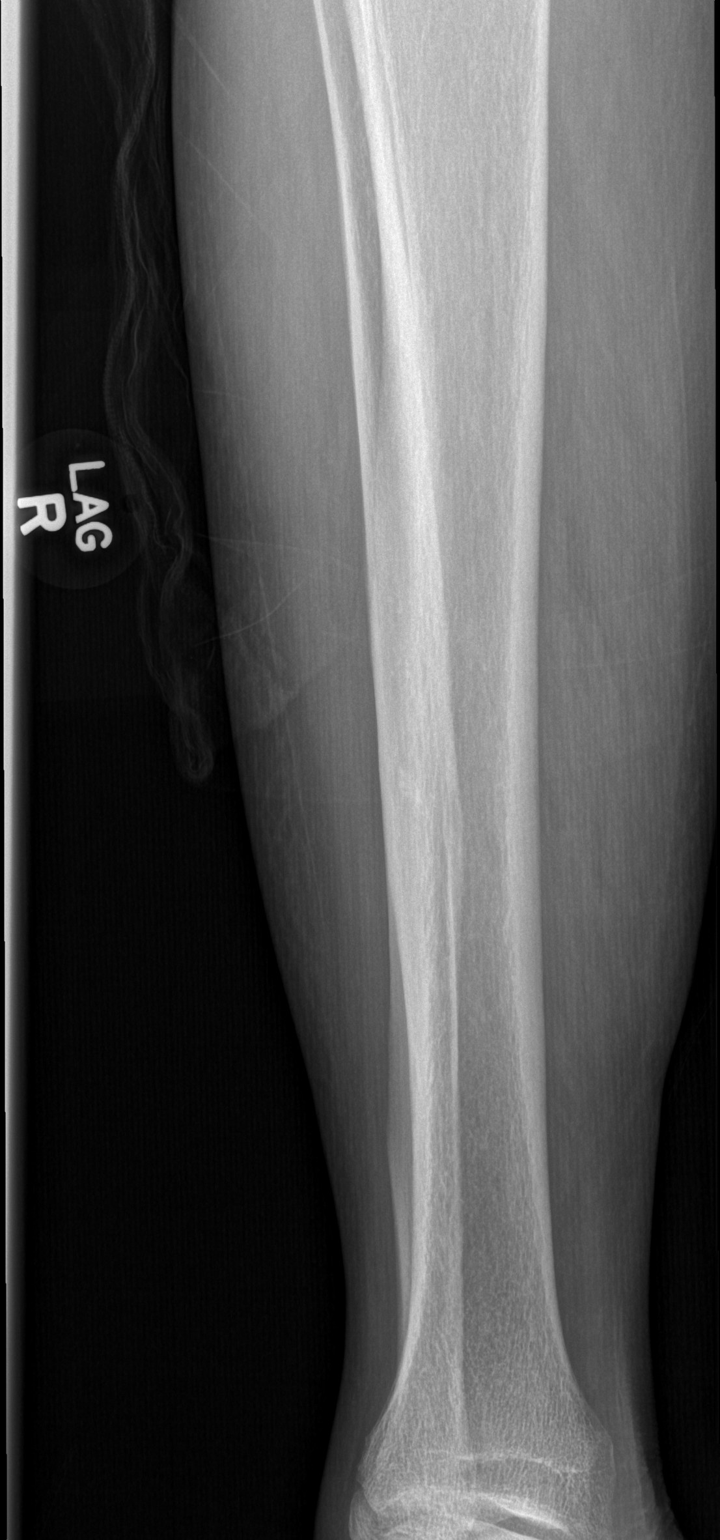

[t tib-fib ap right (2 of 2)]
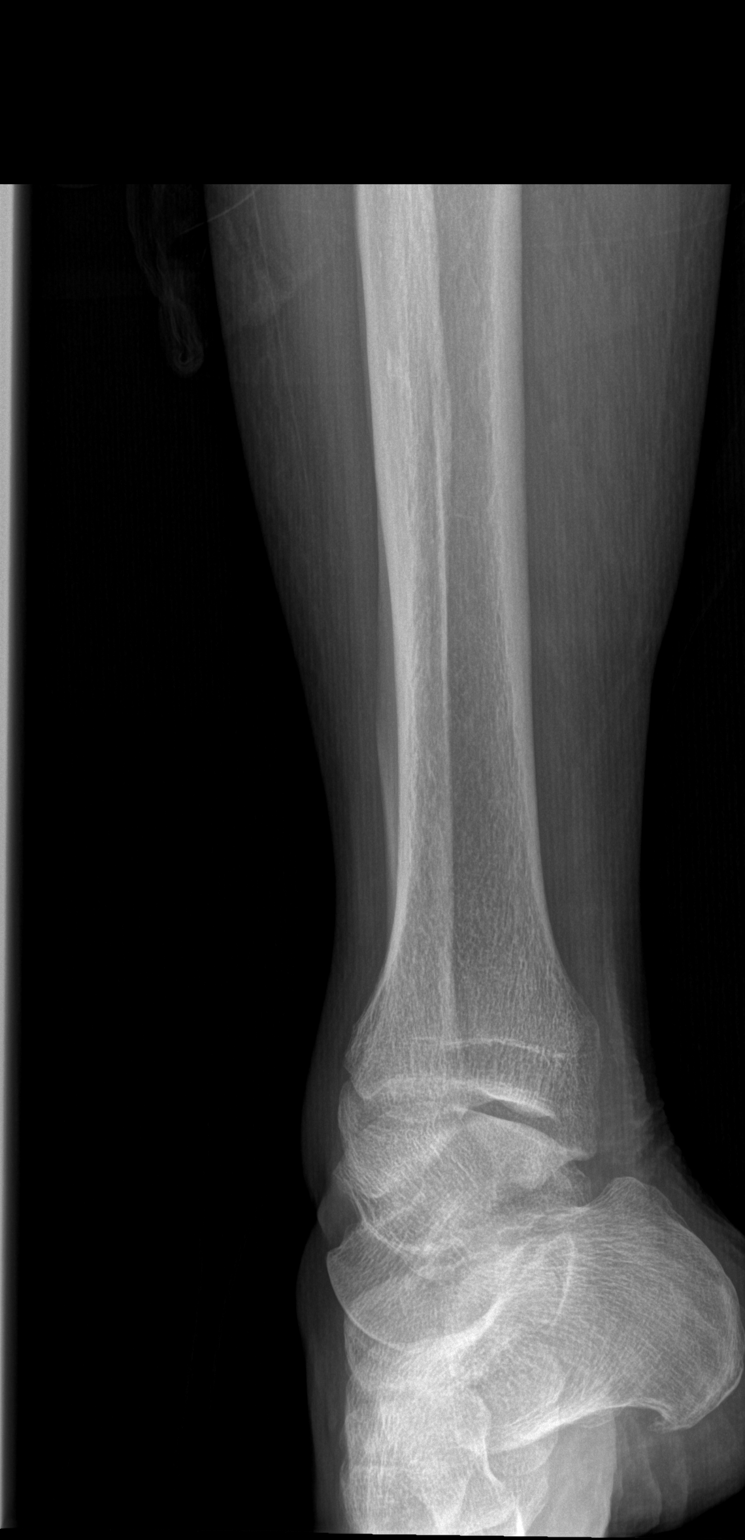

[x tib-fib lat right (1 of 2)]
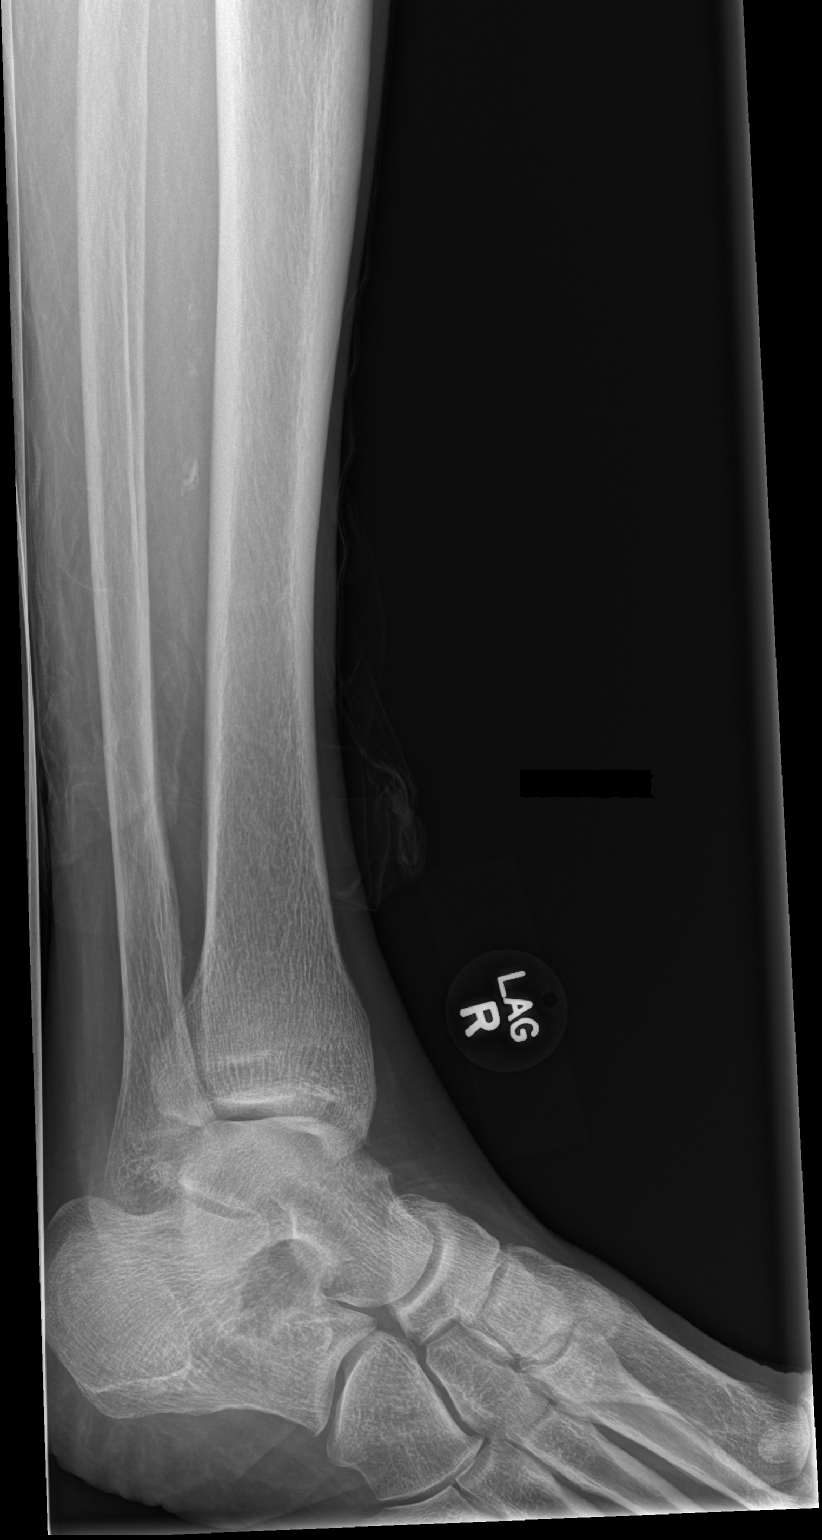

[x tib-fib lat right (2 of 2)]
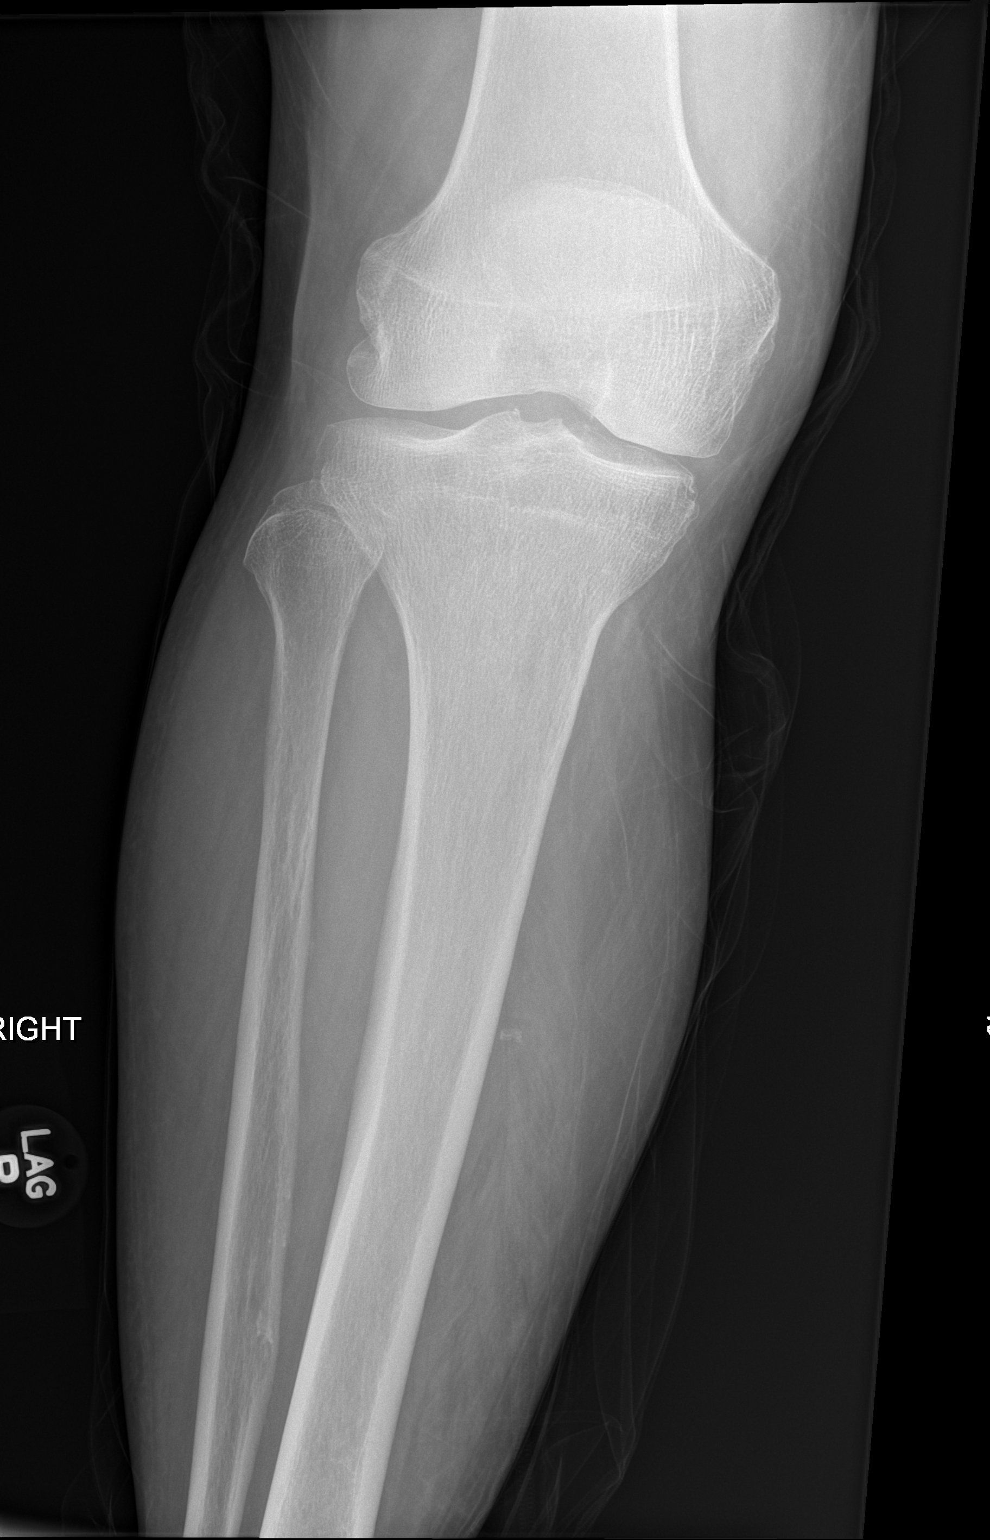

[4 of 4 positions shown; findings below may reference images not displayed]

FINDINGS: There is no evidence of fracture or dislocation. The tibia and
fibula appear grossly intact. The knee joint is grossly unremarkable
in appearance. No knee joint effusion is characterized. The ankle
mortise is incompletely assessed, but appears grossly unremarkable.
No definite soft tissue abnormalities are characterized on
radiograph.
IMPRESSION: No evidence of fracture or dislocation.

## 2022-08-14 DEATH — deceased
